# Patient Record
Sex: Female | Born: 1945 | Race: White | Hispanic: No | Smoking: Former smoker
Health system: Southern US, Community
[De-identification: ages and names within clinical notes are randomized; demographics above are authoritative.]

## PROBLEM LIST (undated history)

## (undated) DIAGNOSIS — E039 Hypothyroidism, unspecified: Secondary | ICD-10-CM

## (undated) DIAGNOSIS — F5231 Female orgasmic disorder: Secondary | ICD-10-CM

## (undated) DIAGNOSIS — F988 Other specified behavioral and emotional disorders with onset usually occurring in childhood and adolescence: Secondary | ICD-10-CM

## (undated) DIAGNOSIS — R6882 Decreased libido: Secondary | ICD-10-CM

## (undated) DIAGNOSIS — I351 Nonrheumatic aortic (valve) insufficiency: Secondary | ICD-10-CM

## (undated) DIAGNOSIS — N951 Menopausal and female climacteric states: Secondary | ICD-10-CM

## (undated) DIAGNOSIS — I639 Cerebral infarction, unspecified: Secondary | ICD-10-CM

## (undated) DIAGNOSIS — R112 Nausea with vomiting, unspecified: Secondary | ICD-10-CM

## (undated) DIAGNOSIS — B009 Herpesviral infection, unspecified: Secondary | ICD-10-CM

## (undated) DIAGNOSIS — N8189 Other female genital prolapse: Secondary | ICD-10-CM

## (undated) DIAGNOSIS — N83209 Unspecified ovarian cyst, unspecified side: Secondary | ICD-10-CM

## (undated) DIAGNOSIS — R87619 Unspecified abnormal cytological findings in specimens from cervix uteri: Secondary | ICD-10-CM

## (undated) DIAGNOSIS — Z9889 Other specified postprocedural states: Secondary | ICD-10-CM

## (undated) DIAGNOSIS — J329 Chronic sinusitis, unspecified: Secondary | ICD-10-CM

## (undated) DIAGNOSIS — D069 Carcinoma in situ of cervix, unspecified: Secondary | ICD-10-CM

## (undated) DIAGNOSIS — Z809 Family history of malignant neoplasm, unspecified: Secondary | ICD-10-CM

## (undated) HISTORY — PX: ROTATOR CUFF REPAIR: SHX139

## (undated) HISTORY — DX: Nonrheumatic aortic (valve) insufficiency: I35.1

## (undated) HISTORY — DX: Family history of malignant neoplasm, unspecified: Z80.9

## (undated) HISTORY — DX: Unspecified abnormal cytological findings in specimens from cervix uteri: R87.619

## (undated) HISTORY — DX: Chronic sinusitis, unspecified: J32.9

## (undated) HISTORY — PX: OTHER SURGICAL HISTORY: SHX169

## (undated) HISTORY — DX: Other specified behavioral and emotional disorders with onset usually occurring in childhood and adolescence: F98.8

## (undated) HISTORY — DX: Carcinoma in situ of cervix, unspecified: D06.9

## (undated) HISTORY — DX: Herpesviral infection, unspecified: B00.9

## (undated) HISTORY — PX: CARPAL TUNNEL RELEASE: SHX101

## (undated) HISTORY — DX: Unspecified ovarian cyst, unspecified side: N83.209

## (undated) HISTORY — PX: THUMB ARTHROSCOPY: SHX2509

## (undated) HISTORY — DX: Menopausal and female climacteric states: N95.1

## (undated) HISTORY — PX: KNEE ARTHROSCOPY: SUR90

## (undated) HISTORY — DX: Female orgasmic disorder: F52.31

## (undated) HISTORY — DX: Cerebral infarction, unspecified: I63.9

## (undated) HISTORY — DX: Other female genital prolapse: N81.89

## (undated) HISTORY — DX: Decreased libido: R68.82

---

## 1995-04-03 DIAGNOSIS — B009 Herpesviral infection, unspecified: Secondary | ICD-10-CM

## 1995-04-03 HISTORY — DX: Herpesviral infection, unspecified: B00.9

## 1997-04-02 DIAGNOSIS — R87619 Unspecified abnormal cytological findings in specimens from cervix uteri: Secondary | ICD-10-CM

## 1997-04-02 HISTORY — DX: Unspecified abnormal cytological findings in specimens from cervix uteri: R87.619

## 1997-11-29 ENCOUNTER — Other Ambulatory Visit: Admission: RE | Admit: 1997-11-29 | Discharge: 1997-11-29 | Payer: Self-pay | Admitting: Obstetrics and Gynecology

## 1998-02-17 ENCOUNTER — Other Ambulatory Visit: Admission: RE | Admit: 1998-02-17 | Discharge: 1998-02-17 | Payer: Self-pay | Admitting: Obstetrics and Gynecology

## 1998-06-07 ENCOUNTER — Other Ambulatory Visit: Admission: RE | Admit: 1998-06-07 | Discharge: 1998-06-07 | Payer: Self-pay | Admitting: Obstetrics and Gynecology

## 1998-10-25 ENCOUNTER — Other Ambulatory Visit: Admission: RE | Admit: 1998-10-25 | Discharge: 1998-10-25 | Payer: Self-pay | Admitting: Obstetrics and Gynecology

## 1999-02-13 ENCOUNTER — Other Ambulatory Visit: Admission: RE | Admit: 1999-02-13 | Discharge: 1999-02-13 | Payer: Self-pay | Admitting: Obstetrics and Gynecology

## 1999-04-03 DIAGNOSIS — D069 Carcinoma in situ of cervix, unspecified: Secondary | ICD-10-CM

## 1999-04-03 HISTORY — DX: Carcinoma in situ of cervix, unspecified: D06.9

## 1999-05-30 ENCOUNTER — Other Ambulatory Visit: Admission: RE | Admit: 1999-05-30 | Discharge: 1999-05-30 | Payer: Self-pay | Admitting: Obstetrics and Gynecology

## 1999-06-14 ENCOUNTER — Other Ambulatory Visit: Admission: RE | Admit: 1999-06-14 | Discharge: 1999-06-14 | Payer: Self-pay | Admitting: Obstetrics and Gynecology

## 1999-09-25 ENCOUNTER — Other Ambulatory Visit: Admission: RE | Admit: 1999-09-25 | Discharge: 1999-09-25 | Payer: Self-pay | Admitting: Obstetrics and Gynecology

## 1999-12-12 ENCOUNTER — Other Ambulatory Visit: Admission: RE | Admit: 1999-12-12 | Discharge: 1999-12-12 | Payer: Self-pay | Admitting: Obstetrics and Gynecology

## 2000-06-12 ENCOUNTER — Other Ambulatory Visit: Admission: RE | Admit: 2000-06-12 | Discharge: 2000-06-12 | Payer: Self-pay | Admitting: Obstetrics and Gynecology

## 2000-12-01 DIAGNOSIS — N8189 Other female genital prolapse: Secondary | ICD-10-CM

## 2000-12-01 HISTORY — DX: Other female genital prolapse: N81.89

## 2000-12-09 ENCOUNTER — Other Ambulatory Visit: Admission: RE | Admit: 2000-12-09 | Discharge: 2000-12-09 | Payer: Self-pay | Admitting: Obstetrics and Gynecology

## 2001-06-02 ENCOUNTER — Other Ambulatory Visit: Admission: RE | Admit: 2001-06-02 | Discharge: 2001-06-02 | Payer: Self-pay | Admitting: Obstetrics and Gynecology

## 2002-01-12 ENCOUNTER — Other Ambulatory Visit: Admission: RE | Admit: 2002-01-12 | Discharge: 2002-01-12 | Payer: Self-pay | Admitting: Obstetrics and Gynecology

## 2002-06-29 ENCOUNTER — Other Ambulatory Visit: Admission: RE | Admit: 2002-06-29 | Discharge: 2002-06-29 | Payer: Self-pay | Admitting: Obstetrics and Gynecology

## 2002-11-28 ENCOUNTER — Emergency Department (HOSPITAL_COMMUNITY): Admission: EM | Admit: 2002-11-28 | Discharge: 2002-11-28 | Payer: Self-pay | Admitting: Emergency Medicine

## 2002-12-22 ENCOUNTER — Encounter: Payer: Self-pay | Admitting: Emergency Medicine

## 2002-12-22 ENCOUNTER — Emergency Department (HOSPITAL_COMMUNITY): Admission: EM | Admit: 2002-12-22 | Discharge: 2002-12-23 | Payer: Self-pay | Admitting: Emergency Medicine

## 2002-12-23 ENCOUNTER — Ambulatory Visit (HOSPITAL_COMMUNITY): Admission: RE | Admit: 2002-12-23 | Discharge: 2002-12-23 | Payer: Self-pay | Admitting: Gastroenterology

## 2002-12-25 ENCOUNTER — Encounter: Payer: Self-pay | Admitting: Surgery

## 2002-12-25 ENCOUNTER — Encounter: Payer: Self-pay | Admitting: Gastroenterology

## 2002-12-25 ENCOUNTER — Ambulatory Visit (HOSPITAL_COMMUNITY): Admission: RE | Admit: 2002-12-25 | Discharge: 2002-12-25 | Payer: Self-pay | Admitting: Gastroenterology

## 2002-12-25 ENCOUNTER — Inpatient Hospital Stay (HOSPITAL_COMMUNITY): Admission: EM | Admit: 2002-12-25 | Discharge: 2002-12-28 | Payer: Self-pay | Admitting: Emergency Medicine

## 2002-12-26 ENCOUNTER — Encounter: Payer: Self-pay | Admitting: Surgery

## 2002-12-26 ENCOUNTER — Encounter (INDEPENDENT_AMBULATORY_CARE_PROVIDER_SITE_OTHER): Payer: Self-pay | Admitting: Specialist

## 2004-04-02 DIAGNOSIS — IMO0002 Reserved for concepts with insufficient information to code with codable children: Secondary | ICD-10-CM

## 2004-04-02 DIAGNOSIS — F5231 Female orgasmic disorder: Secondary | ICD-10-CM

## 2004-04-02 HISTORY — DX: Reserved for concepts with insufficient information to code with codable children: IMO0002

## 2004-04-02 HISTORY — DX: Female orgasmic disorder: F52.31

## 2004-05-23 ENCOUNTER — Other Ambulatory Visit: Admission: RE | Admit: 2004-05-23 | Discharge: 2004-05-23 | Payer: Self-pay | Admitting: Obstetrics and Gynecology

## 2005-05-24 ENCOUNTER — Other Ambulatory Visit: Admission: RE | Admit: 2005-05-24 | Discharge: 2005-05-24 | Payer: Self-pay | Admitting: Obstetrics and Gynecology

## 2007-04-29 ENCOUNTER — Ambulatory Visit (HOSPITAL_BASED_OUTPATIENT_CLINIC_OR_DEPARTMENT_OTHER): Admission: RE | Admit: 2007-04-29 | Discharge: 2007-04-29 | Payer: Self-pay | Admitting: Specialist

## 2008-04-02 DIAGNOSIS — N951 Menopausal and female climacteric states: Secondary | ICD-10-CM

## 2008-04-02 HISTORY — DX: Menopausal and female climacteric states: N95.1

## 2008-04-16 ENCOUNTER — Encounter: Admission: RE | Admit: 2008-04-16 | Discharge: 2008-04-16 | Payer: Self-pay | Admitting: Occupational Medicine

## 2009-04-02 DIAGNOSIS — R6882 Decreased libido: Secondary | ICD-10-CM

## 2009-04-02 HISTORY — DX: Decreased libido: R68.82

## 2010-05-11 ENCOUNTER — Other Ambulatory Visit: Payer: Self-pay | Admitting: Dermatology

## 2010-07-29 ENCOUNTER — Emergency Department (HOSPITAL_COMMUNITY): Payer: BC Managed Care – PPO

## 2010-07-29 ENCOUNTER — Observation Stay (HOSPITAL_COMMUNITY)
Admission: EM | Admit: 2010-07-29 | Discharge: 2010-07-31 | Disposition: A | Discharge status: Home / Self Care | Payer: BC Managed Care – PPO | Attending: Cardiology | Admitting: Cardiology

## 2010-07-29 DIAGNOSIS — R0789 Other chest pain: Secondary | ICD-10-CM

## 2010-07-29 DIAGNOSIS — R079 Chest pain, unspecified: Principal | ICD-10-CM | POA: Insufficient documentation

## 2010-07-29 DIAGNOSIS — R059 Cough, unspecified: Secondary | ICD-10-CM | POA: Insufficient documentation

## 2010-07-29 DIAGNOSIS — R9431 Abnormal electrocardiogram [ECG] [EKG]: Secondary | ICD-10-CM

## 2010-07-29 DIAGNOSIS — R05 Cough: Secondary | ICD-10-CM | POA: Insufficient documentation

## 2010-07-29 DIAGNOSIS — E039 Hypothyroidism, unspecified: Secondary | ICD-10-CM | POA: Insufficient documentation

## 2010-07-29 DIAGNOSIS — E7800 Pure hypercholesterolemia, unspecified: Secondary | ICD-10-CM | POA: Insufficient documentation

## 2010-07-29 LAB — CBC
HCT: 40.6 % (ref 36.0–46.0)
Hemoglobin: 14 g/dL (ref 12.0–15.0)
MCH: 31.3 pg (ref 26.0–34.0)
MCHC: 34.5 g/dL (ref 30.0–36.0)
MCV: 90.8 fL (ref 78.0–100.0)
Platelets: 225 10*3/uL (ref 150–400)
RBC: 4.47 MIL/uL (ref 3.87–5.11)
RDW: 13.5 % (ref 11.5–15.5)
WBC: 4.9 10*3/uL (ref 4.0–10.5)

## 2010-07-29 LAB — POCT CARDIAC MARKERS
CKMB, poc: 1.2 ng/mL (ref 1.0–8.0)
CKMB, poc: 1.2 ng/mL (ref 1.0–8.0)
Myoglobin, poc: 66.8 ng/mL (ref 12–200)
Myoglobin, poc: 61.4 ng/mL (ref 12–200)
Troponin i, poc: <0.05 ng/mL (ref 0.00–0.09)
Troponin i, poc: <0.05 ng/mL (ref 0.00–0.09)

## 2010-07-29 LAB — DIFFERENTIAL
Basophils Absolute: 0 10*3/uL (ref 0.0–0.1)
Basophils Relative: 0 % (ref 0–1)
Eosinophils Absolute: 0.1 10*3/uL (ref 0.0–0.7)
Eosinophils Relative: 2 % (ref 0–5)
Lymphocytes Relative: 25 % (ref 12–46)
Lymphs Abs: 1.2 10*3/uL (ref 0.7–4.0)
Monocytes Absolute: 0.6 10*3/uL (ref 0.1–1.0)
Monocytes Relative: 12 % (ref 3–12)
Neutro Abs: 3 10*3/uL (ref 1.7–7.7)
Neutrophils Relative %: 61 % (ref 43–77)

## 2010-07-29 LAB — TSH: TSH: 2.562 u[IU]/mL (ref 0.350–4.500)

## 2010-07-29 LAB — TROPONIN I: Troponin I: 0.01 ng/mL (ref 0.00–0.06)

## 2010-07-29 LAB — COMPREHENSIVE METABOLIC PANEL
ALT: 67 U/L — ABNORMAL HIGH (ref 0–35)
AST: 51 U/L — ABNORMAL HIGH (ref 0–37)
Albumin: 3.9 g/dL (ref 3.5–5.2)
Alkaline Phosphatase: 48 U/L (ref 39–117)
BUN: 15 mg/dL (ref 6–23)
CO2: 27 mEq/L (ref 19–32)
Calcium: 9 mg/dL (ref 8.4–10.5)
Chloride: 107 mEq/L (ref 96–112)
Creatinine, Ser: 0.69 mg/dL (ref 0.4–1.2)
GFR calc Af Amer: >60 mL/min (ref >60)
GFR calc non Af Amer: >60 mL/min (ref >60)
Glucose, Bld: 93 mg/dL (ref 70–99)
Potassium: 3.9 mEq/L (ref 3.5–5.1)
Sodium: 139 mEq/L (ref 135–145)
Total Bilirubin: 1.1 mg/dL (ref 0.3–1.2)
Total Protein: 6.8 g/dL (ref 6.0–8.3)

## 2010-07-29 LAB — HEPARIN LEVEL (UNFRACTIONATED): Heparin Unfractionated: 0.3 IU/mL (ref 0.30–0.70)

## 2010-07-29 LAB — CK TOTAL AND CKMB (NOT AT ARMC)
CK, MB: 3.7 ng/mL (ref 0.3–4.0)
Relative Index: INVALID (ref 0.0–2.5)
Total CK: 89 U/L (ref 7–177)

## 2010-07-29 LAB — CARDIAC PANEL(CRET KIN+CKTOT+MB+TROPI)
CK, MB: 2.6 ng/mL (ref 0.3–4.0)
Relative Index: INVALID (ref 0.0–2.5)
Total CK: 78 U/L (ref 7–177)
Troponin I: 0.02 ng/mL (ref 0.00–0.06)

## 2010-07-30 LAB — CBC
HCT: 37.4 % (ref 36.0–46.0)
Hemoglobin: 12.5 g/dL (ref 12.0–15.0)
MCH: 30.3 pg (ref 26.0–34.0)
MCHC: 33.4 g/dL (ref 30.0–36.0)
MCV: 90.6 fL (ref 78.0–100.0)
Platelets: 209 10*3/uL (ref 150–400)
RBC: 4.13 MIL/uL (ref 3.87–5.11)
RDW: 13.8 % (ref 11.5–15.5)
WBC: 4.8 10*3/uL (ref 4.0–10.5)

## 2010-07-30 LAB — TSH
TSH: 14.241 u[IU]/mL — ABNORMAL HIGH (ref 0.350–4.500)
TSH: 10.345 u[IU]/mL — ABNORMAL HIGH (ref 0.350–4.500)

## 2010-07-30 LAB — T4, FREE: Free T4: 1.07 ng/dL (ref 0.80–1.80)

## 2010-07-30 LAB — LIPID PANEL
Cholesterol: 216 mg/dL — ABNORMAL HIGH (ref 0–200)
HDL: 68 mg/dL (ref >39)
LDL Cholesterol: 136 mg/dL — ABNORMAL HIGH (ref 0–99)
Total CHOL/HDL Ratio: 3.2 RATIO
Triglycerides: 62 mg/dL (ref <150)
VLDL: 12 mg/dL (ref 0–40)

## 2010-07-30 LAB — HEPARIN LEVEL (UNFRACTIONATED): Heparin Unfractionated: 0.42 IU/mL (ref 0.30–0.70)

## 2010-07-30 LAB — CARDIAC PANEL(CRET KIN+CKTOT+MB+TROPI)
CK, MB: 2.4 ng/mL (ref 0.3–4.0)
Relative Index: INVALID (ref 0.0–2.5)
Total CK: 63 U/L (ref 7–177)
Troponin I: 0.01 ng/mL (ref 0.00–0.06)

## 2010-07-31 ENCOUNTER — Inpatient Hospital Stay (HOSPITAL_COMMUNITY): Payer: BC Managed Care – PPO

## 2010-07-31 DIAGNOSIS — R079 Chest pain, unspecified: Secondary | ICD-10-CM

## 2010-07-31 MED ORDER — IOHEXOL 350 MG/ML SOLN
80.0000 mL | Freq: Once | INTRAVENOUS | Status: AC | PRN
  Administered 2010-07-31: 80 mL via INTRAVENOUS

## 2010-08-01 DIAGNOSIS — F988 Other specified behavioral and emotional disorders with onset usually occurring in childhood and adolescence: Secondary | ICD-10-CM

## 2010-08-01 HISTORY — DX: Other specified behavioral and emotional disorders with onset usually occurring in childhood and adolescence: F98.8

## 2010-08-14 NOTE — Discharge Summary (Signed)
  NAME:  Erica Chase, Erica Chase NO.:  [id number]  MEDICAL RECORD NO.:  [id number]           PATIENT TYPE:  I  LOCATION:  3729                         FACILITY:  [facility]  PHYSICIAN:  Cassell Clement, M.D. DATE OF BIRTH:  [birth year]  DATE OF ADMISSION:  07/29/2010 DATE OF DISCHARGE:  07/31/2010                              DISCHARGE SUMMARY   PROCEDURES: 1. Cardiac CT. 2. Two-view chest x-ray.  PRIMARY FINAL DISCHARGE DIAGNOSIS:  Chest pain, low calcium score and no soft tissue abnormalities noted.  SECONDARY DIAGNOSES: 1. Status post resection of ovarian and ganglion cyst, right thumb     surgery, laparoscopic cholecystectomy and left knee arthroscopy. 2. Allergy or intolerance to BIAXIN and PENICILLIN. 3. Hypothyroidism, new diagnosis with a TSH of 14.241 and a free T4 of     1.07 this admission. 4. Hyperlipidemia, new diagnosis with a total cholesterol 216,     triglycerides 62, HDL 68 and LDL 136 this admission.  TIME AT DISCHARGE:  Thirty three minutes.  HOSPITAL COURSE:  Ms. Adolf is a 66 year old female with no previous cardiac history.  She has a nonproductive cough after a trip and was also having chest pain.  She was admitted for further evaluation.  Her cardiac enzymes were negative for MI.  A lipid profile as described above.  She had no other significant laboratory abnormalities.  Her symptoms improved.  A CT angiogram of the chest was performed on July 31, 2010, and it showed left main, LAD, two diagonals, circumflex, OM-1 and 2, and RCA all normal.  There was one prominent RV branch.  The test was reviewed and it was felt that no further inpatient cardiac workup was indicated.  Dr. Patty Sermons evaluated Ms. Waide and reviewed all the data.  He recommended low-dose Synthroid and follow up with primary care.  She will be encouraged to stick to a heart-healthy diet and is to get a recheck of her cholesterol tests once her thyroid function  is adequately treated.  Ms. Principato was ambulating without chest pain or shortness of breath and is considered stable for discharge on July 31, 2010.  DISCHARGE INSTRUCTIONS: 1. Her activity level is to be increased gradually. 2. She is encouraged to stick to a low-sodium heart-healthy diet. 3. She is to follow up with Dr. Antoine Poche as needed and she is to     follow up with Dr. Cleta Alberts in 6 weeks. 4. She is to get a recheck of her thyroid and cholesterol with Dr.     Cleta Alberts.  DISCHARGE MEDICATIONS: 1. Synthroid 25 mcg daily. 2. Robitussin DM 15 mL q.4 h. p.r.n. for cough. 3. Multivitamins daily. 4. Zyrtec 10 mg daily p.r.n. 5. Ventolin inhaler two puffs q.4 h. p.r.n.     Theodore Demark, PA-C   ______________________________ Cassell Clement, M.D.    RB/MEDQ  D:  07/31/2010  T:  08/01/2010  Job:  [id number]  cc:   Brett Canales A. Cleta Alberts, M.D.  Electronically Signed by Theodore Demark PA-C on 08/09/2010 05:25:12 PM Electronically Signed by Cassell Clement M.D. on 08/14/2010 04:43:16 AM

## 2010-08-15 ENCOUNTER — Other Ambulatory Visit: Payer: Self-pay | Admitting: Otolaryngology

## 2010-08-15 DIAGNOSIS — R05 Cough: Secondary | ICD-10-CM

## 2010-08-15 DIAGNOSIS — R059 Cough, unspecified: Secondary | ICD-10-CM

## 2010-08-15 DIAGNOSIS — R490 Dysphonia: Secondary | ICD-10-CM

## 2010-08-15 NOTE — Op Note (Signed)
NAME:  Erica Chase, Erica Chase NO.:  [id number]   MEDICAL RECORD NO.:  [id number]          PATIENT TYPE:  AMB   LOCATION:  NESC                         FACILITY:  [facility]   PHYSICIAN:  Jene Every, M.D.    DATE OF BIRTH:  [birth year]   DATE OF PROCEDURE:  04/29/2007  DATE OF DISCHARGE:                               OPERATIVE REPORT   PREOPERATIVE DIAGNOSES:  1. Lateral meniscus tear.  2. Chondromalacia of the left knee.   POSTOPERATIVE DIAGNOSES:  1. Lateral meniscus tear.  2. Chondromalacia of the left knee, grade 3 chondromalacia of the      medial femoral condyle.  3. Mild fraying of the lateral meniscus.   PROCEDURES PERFORMED:  1. Left knee arthroscopy.  2. Chondroplasty, medial femoral condyle.  3. Shaving of lateral meniscus.   BRIEF HISTORY AND INDICATION:  A 65 year old, knee injury at work,  indicating a possible lateral medial meniscus tear.  She had some medial  joint line tenderness as well.  Refractory to conservative treatment.  Was indicated for a diagnostic arthroscopy and treatment.  Risks and  benefits discussed including bleeding, infection, no change in her  symptoms, worsening symptoms, the need for repeat debridement in the  future, DVT, PE, anesthetic complications, etc.   TECHNIQUE:  Patient in supine position.  After induction of adequate  general anesthesia, 1 g Kefzol, the left lower extremity was prepped and  draped in the usual sterile fashion.  A lateral parapatellar portal and  a superomedial parapatellar portal were fashioned with a #11 blade,  ingress cannula atraumatically placed, and irrigant was utilized to  insufflate the joint.  Using direct visualization, a medial parapatellar  portal was fashioned with a #11 blade after localization with an 18-  gauge needle, sparing the medial meniscus.  Noted were some grade 3  changes in the medial femoral condyle.  The medial meniscus was stable  to probe palpation without evidence of  tear.  The chondral area of the  femoral condyle was probed.  It was approximately 2 mm thick.  I  introduced the shaver and utilized it to perform a brief chondroplasty  of the femoral condyle.  The remainder of the condyle was unremarkable,  as was the tibial plateau.  ACL and PCL were unremarkable.  Lateral  meniscus had some degenerative fraying of the anterior horn.  This was  shaved as well.  The remainder was stable to probe palpation without  evidence of significant tearing and it was stable.  Normal femoral  condyle and tibial plateau were appreciated and palpated.  In the  suprapatellar pouch, there was normal patellofemoral tracking.  No  evidence of significant chondromalacia.  The gutters were unremarkable.  There was some plica that did not appear to be pathologic as there was  flexion/extension without impingement upon the plica.   I reexamined the medial compartment.  No residual or pathology amenable  to arthroscopic intervention.  All instrumentation was removed.  The  portals were closed with 4-0 nylon simple sutures, 0.25% Marcaine with  epinephrine was infiltrated in the joint.  The wound was dressed  sterilely.  Awoken without difficulty and transported to the recovery  room in satisfactory condition.   The patient tolerated the procedure well with no complications.      Jene Every, M.D.  Electronically Signed     JB/MEDQ  D:  04/29/2007  T:  04/30/2007  Job:  [id number]

## 2010-08-16 ENCOUNTER — Ambulatory Visit
Admission: RE | Admit: 2010-08-16 | Discharge: 2010-08-16 | Disposition: A | Discharge status: Home / Self Care | Payer: BC Managed Care – PPO | Source: Ambulatory Visit | Attending: Otolaryngology | Admitting: Otolaryngology

## 2010-08-16 DIAGNOSIS — R05 Cough: Secondary | ICD-10-CM

## 2010-08-16 DIAGNOSIS — R490 Dysphonia: Secondary | ICD-10-CM

## 2010-08-16 DIAGNOSIS — R059 Cough, unspecified: Secondary | ICD-10-CM

## 2010-08-18 NOTE — Op Note (Signed)
   NAME:  Erica Chase, Erica Chase NO.:  [id number]   MEDICAL RECORD NO.:  [id number]                   PATIENT TYPE:  AMB   LOCATION:  ENDO                                 FACILITY:  [facility]   PHYSICIAN:  Anselmo Rod, M.D.               DATE OF BIRTH:  01/15/1946   DATE OF PROCEDURE:  12/23/2002  DATE OF DISCHARGE:                                 OPERATIVE REPORT   PROCEDURE PERFORMED:  Esophagogastroduodenoscopy.   ENDOSCOPIST:  Charna Elizabeth, M.D.   INSTRUMENT USED:  Olympus video panendoscope.   INDICATIONS FOR PROCEDURE:  Severe epigastric pain with a normal abdominal  ultrasound in a 65 year old white female.  Rule out peptic ulcer disease,  esophagitis, gastritis, etc.   PREPROCEDURE PREPARATION:  Informed consent was procured from the patient.  The patient was fasted for eight hours prior to the procedure.   PREPROCEDURE PHYSICAL:  The patient had stable vital signs.  Neck supple,  chest clear to auscultation.  S1, S2 regular.  Abdomen soft with normal  bowel sounds.   DESCRIPTION OF PROCEDURE:  The patient was placed in the left lateral  decubitus position and sedated with 50 mg of Demerol and 5 mg of Versed  intravenously.  Once the patient was adequately sedated and maintained on  low-flow oxygen and continuous cardiac monitoring, the Olympus video  panendoscope was advanced through the mouth piece over the tongue into the  esophagus under direct vision.  The entire esophagus appeared normal with no  evidence of ring, stricture, masses, esophagitis or Barrett's mucosa.  The  scope was then advanced to the stomach.  The entire gastric mucosa and  proximal small bowel appeared normal as well as retroflexion in the high  cardia revealed no abnormalities.   IMPRESSION:  Normal esophagogastroduodenoscopy.  No ulcers, erosions, masses  or polyps seen.   RECOMMENDATIONS:  1. Plan HIDA scan with CCK injections in the near future.  2. Continue  proton pump inhibitor.  3.     Avoid nonsteroidals.  4. Follow up in the office after the HIDA scan has been done..  5. Low fat diet for now.                                                Anselmo Rod, M.D.    JNM/MEDQ  D:  12/23/2002  T:  12/24/2002  Job:  [id number]   cc:   Janine Limbo, M.D.  [address], [address]  [place]  [place] [address]  Fax: [phone]

## 2010-08-18 NOTE — Discharge Summary (Signed)
   NAME:  OBIE, SILOS NO.:  [id number]   MEDICAL RECORD NO.:  [id number]                   PATIENT TYPE:  INP   LOCATION:  5705                                 FACILITY:  [facility]   PHYSICIAN:  Ollen Gross. Vernell Morgans, M.D.              DATE OF BIRTH:  12-Jan-1946   DATE OF ADMISSION:  12/25/2002  DATE OF DISCHARGE:  12/28/2002                                 DISCHARGE SUMMARY   Ms. Primiano is a 65 year old white female who was admitted with possible  biliary dyskinesia.  She was taken to the operating room on December 26, 2002, where she underwent a laparoscopic cholecystectomy with IOC which was  normal.  She tolerated the procedure well and on postoperative day #1 was  ready for discharge home.   FINAL DIAGNOSIS:  Biliary dyskinesia.   CONDITION ON DISCHARGE:  Stable.   DIET:  As tolerated.   ACTIVITY:  No heavy lifting.   DISCHARGE MEDICATIONS:  She is to resume her home medications.  She was  given a prescription for Vicodin for pain.   FOLLOWUP:  Follow up will be with Dr. Carolynne Edouard in two weeks.   DISPOSITION:  She was discharged home.                                                Ollen Gross. Vernell Morgans, M.D.    PST/MEDQ  D:  02/09/2003  T:  02/09/2003  Job:  [id number]

## 2010-08-18 NOTE — H&P (Signed)
NAME:  Erica Chase, Erica Chase NO.:  [id number]   MEDICAL RECORD NO.:  [id number]                   PATIENT TYPE:  INP   LOCATION:  5705                                 FACILITY:  [facility]   PHYSICIAN:  Abigail Miyamoto, M.D.              DATE OF BIRTH:  Aug 24, 1945   DATE OF ADMISSION:  12/25/2002  DATE OF DISCHARGE:                                HISTORY & PHYSICAL   DATE OF PROCEDURE:  December 25, 2002.   CHIEF COMPLAINT:  Abdominal pain.   HISTORY:  This is a 64 year old female, patient of Dr. Anselmo Rod, who  has had now for approximately two months epigastric abdominal pain and  nausea which has been getting worse.  She reports that she has had several  attacks of pain which now appears to be precipitated by anything she eats.  She is a vegetarian.  She denies any jaundice with this and has had no  emesis.  She is also moving her bowels well.  She has had no fever, chills,  chest pain, or shortness of breath.  The pains are described as sharp and  epigastric.  She has had no dysuria.   PAST MEDICAL HISTORY:  Negative.   PAST SURGICAL HISTORY:  Surgery for ovarian cyst and a ganglion cyst removal  on the left wrist.   MEDICATIONS:  None.   ALLERGIES:  No known drug allergies.   SOCIAL HISTORY:  She does not smoke.  She does not drink alcohol.   PHYSICAL EXAMINATION:  GENERAL: Thin, well developed, well nourished female  in no acute distress.  VITAL SIGNS:  She is afebrile.  Vital signs are stable.  HEENT:  Eyes: She is anicteric.  Pupils are reactive bilaterally.  Ears,  nose, mouth, and throat: External ears and nose are normal. Oropharynx is  clear.  NECK:  Supple.  There is no cervical adenopathy.  There is no thyromegaly.  LUNGS:  Respiratory effort is normal.  Lungs clear to auscultation  bilaterally.  CARDIOVASCULAR:  Regular rate and rhythm with no murmurs.  There is no  peripheral edema.  ABDOMEN:  Soft and flat.  There is mild  to moderate tenderness in the  epigastrium with minimal guarding.  There are no masses.  There is no  organomegaly.  There are no hernias.  EXTREMITIES:  Warm and well perfused.   LABORATORY DATA INCLUDING X-RAY DATA:  The patient has an ultrasound showing  her to have normal-appearing gallbladder.  There is a question of some fluid  around the gallbladder fossa.  There is normal common bile duct.  There are  no stones.   HIDA scan performed shows a 3% ejection fraction.   The patient has laboratory data showing white blood count of 10 with 87%  neutrophils.  Hemoglobin normal.  Liver function tests normal except for a  bilirubin of 2.0.   IMPRESSION AND PLAN:  This is  a patient with biliary dyskinesia and possible  chronic cholecystitis.  At this point, the plan will be to admit her to the  hospital for IV rehydration and then proceed with cholecystectomy and  cholangiogram.  I have discussed the laparoscopic approach with her.  We  have discussed the risks of surgery including bleeding, infection, common  bile duct injury, need for open surgery, bile leak, and the chance this may  not solve all her discomfort.  At this point, she wishes to proceed.  Surgery will thus be scheduled.                                                Abigail Miyamoto, M.D.    DB/MEDQ  D:  12/25/2002  T:  12/26/2002  Job:  [id number]   cc:   Anselmo Rod, M.D.  [address]  [address]  [place]  [place] [address]  Fax: [phone]

## 2010-08-18 NOTE — Op Note (Signed)
NAME:  Erica Chase, Erica Chase NO.:  [id number]   MEDICAL RECORD NO.:  [id number]                   PATIENT TYPE:  INP   LOCATION:  5705                                 FACILITY:  [facility]   PHYSICIAN:  Ollen Gross. Vernell Morgans, M.D.              DATE OF BIRTH:  1945-09-11   DATE OF PROCEDURE:  12/26/2002  DATE OF DISCHARGE:  12/28/2002                                 OPERATIVE REPORT   PREOPERATIVE DIAGNOSES:  Biliary dyskinesia.   POSTOPERATIVE DIAGNOSES:  Biliary dyskinesia.   OPERATION PERFORMED:  Laparoscopic cholecystectomy with intraoperative  cholangiogram.   SURGEON:  Ollen Gross. Carolynne Edouard, M.D.   ASSISTANT:  Currie Paris, M.D.   ANESTHESIA:  General endotracheal.   DESCRIPTION OF PROCEDURE:  After informed consent was obtained, the patient  was brought to the operating room and placed in supine position on the  operating table.  After adequate induction of general anesthesia, the  patient's abdomen was prepped with Betadine and draped in the usual sterile  manner.  The area below the umbilicus was infiltrated with 25% Marcaine. A  small incision was made with a 15 blade knife.  This incision was carried  down through the subcutaneous tissue bluntly with a Kelly clamp and army-  navy retractors until the linea alba was identified.  The linea alba was  incised with a 15 blade knife.  Each side was addressed with Kocher clamps  and elevated anteriorly.  The preperitoneal space was then probed bluntly  with a hemostat until the peritoneum was opened and access was gained to the  abdominal cavity.  A 0 Vicryl pursestring stitch was placed in the fascia  surrounding the opening.  Hasson cannula was placed through the opening and  anchored in place with a previously placed Vicryl pursestring stitch.  The  abdomen was then insufflated with carbon without difficulty.  The patient  was then placed in the head up position.  The laparoscope was placed through  the  Hasson cannula and the right upper quadrant was inspected.  The liver  and dome of the gallbladder were readily identified. Next, the epigastric  region was infiltrated with 0.25% Marcaine and a small incision was made  with a 15 blade knife and a 10 mm port was placed bluntly through this  incision into the abdominal cavity under direct vision.  Sites were then  chosen laterally on the right side of the abdomen for placement of 5 mm  ports.  Each of these areas was infiltrated with 0.25% Marcaine.  Small stab  incisions were made with a 15 blade knife.  5 mm ports were then placed  bluntly through these incisions into the abdominal cavity under direct  vision.  A blunt grasper was placed through the lateral most 5 mm port and  used to grasp the dome of the gallbladder and elevate it anteriorly and  superiorly.  Another blunt grasper  was placed through the other 5 mm port  and used to retract on the body and neck of the gallbladder.  A dissector  was placed through the upper midline port and using the combination of blunt  dissection and electrocautery, some adhesions to the body of the gallbladder  were taken down.  Once this was accomplished, the gallbladder was very  mobile and was able to be elevated.  The peritoneal reflection of the  gallbladder neck was then opened using electrocautery and blunt dissection  was then carried out in this area until the gallbladder neck, cystic duct  junction was readily identified and a good window was created.  A single  clip was placed on the gallbladder neck.  A small ductotomy was made.  A 14  gauge Angiocath was placed percutaneously through the anterior abdominal  wall under direct vision.  A Reddick cholangiogram catheter was then placed  through the Angiocath and flushed.  The Reddick catheter was placed within  the cystic duct anchored in placed within the cystic duct and anchored in  place with the clip.  The cholangiogram was obtained.  This  showed good  length of the cystic duct, no filling defects and good emptying into the  duodenum.  The anchoring clip and catheter was then removed from the  patient.  Three clips were placed proximally on the cystic duct and the duct  was divided between the two sets of clips.  Posterior to this, the cystic  artery was identified and again dissected in a circumferential manner  bluntly until a good window was created.  Two clips were placed proximally  and one distally on the artery and the artery was divided between the two.  Next, a laparoscopic cautery device was used to separate the gallbladder  from the liver bed.  Prior to completely detaching the gallbladder from the  liver bed, the liver bed was inspected and several small bleeding points  were coagulated with the electrocautery.  The gallbladder was then detached  the rest of the way from the liver bed without difficulty.  The laparoscope  was then moved to the upper midline port and the gallbladder grasper was  placed through the Hasson cannula.  The gallbladder neck was then grasped  with a gallbladder grasper and removed with the Hasson cannula through the  infraumbilical port without difficulty.  The fascial defect was closed with  the previously placed Vicryl pursestring stitch.  The abdomen was then  irrigated with copious amounts of saline until the effluent was clear.  The  liver bed was inspected again and found to be hemostatic.  At this point the  ports were removed under direct vision and all were found to be hemostatic.  The gas was allowed to escape.  The skin incisions were all closed with  interrupted 4-0 Monocryl subcuticular stitches.  Benzoin and Steri-Strips  were applied.  The patient tolerated the procedure well.  At the end of the  case all sponge, needle and instrument counts were correct.  The patient was  awakened and taken to the recovery room in stable condition.                                                   Ollen Gross. Vernell Morgans, M.D.    PST/MEDQ  D:  12/26/2002  T:  12/28/2002  Job:  [id number]

## 2010-09-20 NOTE — H&P (Signed)
NAME:  NABA, SNEED NO.:  [id number]  MEDICAL RECORD NO.:  [id number]           PATIENT TYPE:  I  LOCATION:  3729                         FACILITY:  [facility]  PHYSICIAN:  Rollene Rotunda, MD, FACCDATE OF BIRTH:  01/09/1946  DATE OF ADMISSION:  07/29/2010 DATE OF DISCHARGE:                             HISTORY & PHYSICAL   PRIMARY REFERRING:  Brett Canales A. Daub, MD  REASON FOR PRESENTATION:  Evaluate the patient with an abnormal EKG and chest discomfort.  HISTORY OF PRESENT ILLNESS:  The patient is a pleasant 65 year old white female without prior cardiac history.  She has actually been quite well and practices aggressive preventive care.  She has had a cough.  She had this few weeks ago, and felt it might be allergies.  It actually improved after she traveled to the [place].  However, she has returned here, and has had persistent cough, nonproductive.  She was treated with some over-the-counter medications including Zyrtec without improvement. She does not had fevers or chills.  She presented to Urgent Care today to discuss this cough.  There, she did describe 4-5 days with her chest discomfort.  This has been left upper chest.  It comes on sporadically. Last for several minutes at a time.  It is perhaps 3/10 in intensity. There is no radiation to the jaw or to the arms.  She cannot reproduce this with activity, and she is physically active, exercising routinely. She does not get associated nausea, vomiting, or diaphoresis.  She does not have any palpitations, presyncope, or syncope.  Today in Urgent Care, she did have some discomfort under her left breast, which was not like previous.  She cannot quantify or qualify this.  However, she was found to have T-wave inversions in the inferior leads and also in lead V3 and V4.  There was some slight evidence of this and previous EKGs though this was more pronounced.  There was no ST-segment elevation. She does have  early transition in lead V2.  Point-of-care markers x1 have been negative.  PAST MEDICAL HISTORY:  None.  PAST SURGICAL HISTORY:  Ovarian cyst resected, ganglion cyst resected, right thumb surgery, laparoscopic cholecystectomy, left knee arthroscopy.  ALLERGIES:  BIAXIN and PENICILLIN.  MEDICATIONS:  None.  SOCIAL HISTORY:  The patient quit smoking in 1970.  She is a Runner, broadcasting/film/video with special needs.  She exercises routinely.  She believes in nutraceuticals.  FAMILY HISTORY:  Noncontributory for early coronary artery disease.  REVIEW OF SYSTEMS:  As stated in the HPI, negative for all other systems.  PHYSICAL EXAMINATION:  GENERAL:  The patient is pleasant and in no distress. VITAL SIGNS:  Blood pressure 123/57, heart rate 61 and regular, afebrile. HEENT:  Eyes are unremarkable.  Pupils equal, round, and reactive to light, fundi not visualized, oral mucosa normal. NECK:  No jugular venous distention at 45 degrees.  Carotid upstroke brisk and symmetrical.  No bruits.  No thyromegaly. LYMPHATICS:  No cervical, axillary, or inguinal adenopathy. LUNGS:  Clear to auscultation bilaterally. BACK:  No costovertebral angle tenderness. CHEST:  Unremarkable. HEART:  PMI not displaced or sustained.  S1 and S2 within  normal limits. No S3, no S4, no clicks, rubs, no murmurs. ABDOMEN:  Flat, positive bowel sounds, normal in frequency and pitch, no bruits, no rebound, no guarding, no midline pulsatile mass, no hepatomegaly, no splenomegaly. SKIN:  No rashes, no nodules. EXTREMITIES:  A 2+ pulses throughout, no edema, no cyanosis, no clubbing. NEURO:  Oriented to person, place, and time, cranial nerves II through XII grossly intact, motor grossly intact throughout.  EKG, sinus rhythm, rate 59, axis leftward, intervals within normal limits, inferior T-wave inversions II, III, aVF, anterolateral T-wave inversion in V2-V6.  LABORATORY DATA:  Sodium 139, potassium 3.9, BUN 15, creatinine  0.69. WBC 4.9, hemoglobin 14.0, platelets 225.  POC negative x1.  Chest x-ray, no disease.  ASSESSMENT AND PLAN: 1. Abnormal EKG.  The patient does have an abnormal EKG with some     chest discomfort.  However, the chest discomfort is somewhat     atypical.  I think that pretest probability of obstructive coronary     disease is moderate.  I thinks cardiac CT angiography would be an     excellent modality to rule out obstructive coronary disease, and I     will order this.  We will manage her with low-dose aspirin and     heparin, and I will cycle enzymes.  I will also check risk factors     to include a fasting lipid profile. 2. Cough.  I will treat this with guaifenesin and symptomatically as     there does not appear to be any abnormality on chest x-ray.     Rollene Rotunda, MD, [facility]     JH/MEDQ  D:  07/29/2010  T:  07/29/2010  Job:  [id number]  cc:   Brett Canales A. Cleta Alberts, M.D.  Electronically Signed by Rollene Rotunda MD [facility] on 09/20/2010 11:27:27 AM

## 2010-11-06 ENCOUNTER — Ambulatory Visit (HOSPITAL_COMMUNITY)
Admission: RE | Admit: 2010-11-06 | Discharge: 2010-11-06 | Disposition: A | Discharge status: Home / Self Care | Payer: BC Managed Care – PPO | Source: Ambulatory Visit | Attending: Otolaryngology | Admitting: Otolaryngology

## 2010-11-06 DIAGNOSIS — K219 Gastro-esophageal reflux disease without esophagitis: Secondary | ICD-10-CM | POA: Insufficient documentation

## 2010-11-06 DIAGNOSIS — R490 Dysphonia: Secondary | ICD-10-CM | POA: Insufficient documentation

## 2011-01-01 DIAGNOSIS — J329 Chronic sinusitis, unspecified: Secondary | ICD-10-CM

## 2011-01-01 HISTORY — PX: CHOLECYSTECTOMY: SHX55

## 2011-01-01 HISTORY — DX: Chronic sinusitis, unspecified: J32.9

## 2011-02-26 ENCOUNTER — Other Ambulatory Visit: Payer: Self-pay | Admitting: Otolaryngology

## 2011-03-01 ENCOUNTER — Encounter (HOSPITAL_BASED_OUTPATIENT_CLINIC_OR_DEPARTMENT_OTHER): Payer: Self-pay | Admitting: *Deleted

## 2011-03-01 NOTE — Progress Notes (Signed)
To [facility] at 1245. Hg and EKG on arrival.NPO  after mn.may have clear liquids only until 0800,to use inhaler that am and take synthroid.

## 2011-03-05 ENCOUNTER — Ambulatory Visit (HOSPITAL_BASED_OUTPATIENT_CLINIC_OR_DEPARTMENT_OTHER): Payer: BC Managed Care – PPO | Admitting: Anesthesiology

## 2011-03-05 ENCOUNTER — Encounter (HOSPITAL_BASED_OUTPATIENT_CLINIC_OR_DEPARTMENT_OTHER): Payer: Self-pay | Admitting: *Deleted

## 2011-03-05 ENCOUNTER — Encounter (HOSPITAL_BASED_OUTPATIENT_CLINIC_OR_DEPARTMENT_OTHER)
Admission: RE | Disposition: A | Discharge status: Home / Self Care | Payer: Self-pay | Source: Ambulatory Visit | Attending: Specialist

## 2011-03-05 ENCOUNTER — Encounter (HOSPITAL_BASED_OUTPATIENT_CLINIC_OR_DEPARTMENT_OTHER): Payer: Self-pay | Admitting: Anesthesiology

## 2011-03-05 ENCOUNTER — Ambulatory Visit (HOSPITAL_BASED_OUTPATIENT_CLINIC_OR_DEPARTMENT_OTHER)
Admission: RE | Admit: 2011-03-05 | Discharge: 2011-03-05 | Disposition: A | Discharge status: Home / Self Care | Payer: BC Managed Care – PPO | Source: Ambulatory Visit | Attending: Specialist | Admitting: Specialist

## 2011-03-05 DIAGNOSIS — X58XXXA Exposure to other specified factors, initial encounter: Secondary | ICD-10-CM | POA: Insufficient documentation

## 2011-03-05 DIAGNOSIS — Z79899 Other long term (current) drug therapy: Secondary | ICD-10-CM | POA: Insufficient documentation

## 2011-03-05 DIAGNOSIS — M2240 Chondromalacia patellae, unspecified knee: Secondary | ICD-10-CM | POA: Insufficient documentation

## 2011-03-05 DIAGNOSIS — J45909 Unspecified asthma, uncomplicated: Secondary | ICD-10-CM | POA: Insufficient documentation

## 2011-03-05 DIAGNOSIS — IMO0002 Reserved for concepts with insufficient information to code with codable children: Secondary | ICD-10-CM | POA: Insufficient documentation

## 2011-03-05 DIAGNOSIS — S83289A Other tear of lateral meniscus, current injury, unspecified knee, initial encounter: Secondary | ICD-10-CM

## 2011-03-05 HISTORY — PX: KNEE ARTHROSCOPY: SHX127

## 2011-03-05 HISTORY — DX: Other specified postprocedural states: R11.2

## 2011-03-05 HISTORY — DX: Hypothyroidism, unspecified: E03.9

## 2011-03-05 HISTORY — DX: Other specified postprocedural states: Z98.890

## 2011-03-05 LAB — POCT HEMOGLOBIN-HEMACUE: Hemoglobin: 14.1 g/dL (ref 12.0–15.0)

## 2011-03-05 SURGERY — ARTHROSCOPY, KNEE
Anesthesia: General | Site: Knee | Laterality: Left

## 2011-03-05 MED ORDER — PROMETHAZINE HCL 25 MG/ML IJ SOLN: 6.2500 mg | INTRAMUSCULAR | Status: DC | PRN

## 2011-03-05 MED ORDER — PROPOFOL 10 MG/ML IV EMUL
INTRAVENOUS | Status: DC | PRN
  Administered 2011-03-05: 200 mg via INTRAVENOUS

## 2011-03-05 MED ORDER — FENTANYL CITRATE 0.05 MG/ML IJ SOLN: 25.0000 ug | INTRAMUSCULAR | Status: DC | PRN

## 2011-03-05 MED ORDER — SODIUM CHLORIDE 0.9 % IR SOLN
Status: DC | PRN
  Administered 2011-03-05: 15:00:00

## 2011-03-05 MED ORDER — LACTATED RINGERS IV SOLN: INTRAVENOUS | Status: DC

## 2011-03-05 MED ORDER — HYDROCODONE-ACETAMINOPHEN 5-325 MG PO TABS
1.0000 | ORAL_TABLET | Freq: Four times a day (QID) | ORAL | Status: AC | PRN
  Administered 2011-03-05: 1 via ORAL

## 2011-03-05 MED ORDER — MIDAZOLAM HCL 5 MG/5ML IJ SOLN
INTRAMUSCULAR | Status: DC | PRN
  Administered 2011-03-05 (×2): 1 mg via INTRAVENOUS

## 2011-03-05 MED ORDER — ONDANSETRON HCL 4 MG/2ML IJ SOLN
INTRAMUSCULAR | Status: DC | PRN
  Administered 2011-03-05: 4 mg via INTRAVENOUS

## 2011-03-05 MED ORDER — DEXAMETHASONE SODIUM PHOSPHATE 4 MG/ML IJ SOLN
INTRAMUSCULAR | Status: DC | PRN
  Administered 2011-03-05: 8 mg via INTRAVENOUS

## 2011-03-05 MED ORDER — CHLORHEXIDINE GLUCONATE 4 % EX LIQD: 60.0000 mL | Freq: Once | CUTANEOUS | Status: DC

## 2011-03-05 MED ORDER — EPHEDRINE SULFATE 50 MG/ML IJ SOLN
INTRAMUSCULAR | Status: DC | PRN
  Administered 2011-03-05: 10 mg via INTRAVENOUS

## 2011-03-05 MED ORDER — LACTATED RINGERS IV SOLN
INTRAVENOUS | Status: DC
  Administered 2011-03-05: 13:00:00 via INTRAVENOUS
  Administered 2011-03-05: 100 mL/h via INTRAVENOUS
  Administered 2011-03-05: 16:00:00 via INTRAVENOUS

## 2011-03-05 MED ORDER — CEFAZOLIN SODIUM 1-5 GM-% IV SOLN
1.0000 g | INTRAVENOUS | Status: AC
  Administered 2011-03-05: 1 g via INTRAVENOUS

## 2011-03-05 MED ORDER — LIDOCAINE HCL (CARDIAC) 20 MG/ML IV SOLN
INTRAVENOUS | Status: DC | PRN
  Administered 2011-03-05: 60 mg via INTRAVENOUS

## 2011-03-05 MED ORDER — SCOPOLAMINE 1 MG/3DAYS TD PT72
1.0000 | MEDICATED_PATCH | TRANSDERMAL | Status: DC
  Administered 2011-03-05: 1.5 mg via TRANSDERMAL

## 2011-03-05 MED ORDER — ASPIRIN BUFFERED 325 MG PO TABS: 325.0000 mg | ORAL_TABLET | Freq: Every day | ORAL | Status: DC

## 2011-03-05 MED ORDER — FENTANYL CITRATE 0.05 MG/ML IJ SOLN
INTRAMUSCULAR | Status: DC | PRN
  Administered 2011-03-05: 50 ug via INTRAVENOUS
  Administered 2011-03-05: 25 ug via INTRAVENOUS
  Administered 2011-03-05: 50 ug via INTRAVENOUS

## 2011-03-05 MED ORDER — BUPIVACAINE-EPINEPHRINE 0.5% -1:200000 IJ SOLN
INTRAMUSCULAR | Status: DC | PRN
  Administered 2011-03-05: 25 mL

## 2011-03-05 SURGICAL SUPPLY — 47 items
BANDAGE ELASTIC 6 VELCRO ST LF (GAUZE/BANDAGES/DRESSINGS) ×2 IMPLANT
BLADE 4.2CUDA (BLADE) IMPLANT
BLADE CUDA SHAVER 3.5 (BLADE) ×2 IMPLANT
BLADE GREAT WHITE 4.2 (BLADE) IMPLANT
BOOTIES KNEE HIGH SLOAN (MISCELLANEOUS) ×2 IMPLANT
CANISTER SUCT LVC 12 LTR MEDI- (MISCELLANEOUS) ×2 IMPLANT
CANISTER SUCTION 1200CC (MISCELLANEOUS) IMPLANT
CANISTER SUCTION 2500CC (MISCELLANEOUS) IMPLANT
CANNULA ACUFLEX KIT 5X76 (CANNULA) IMPLANT
CAST PADDING STERILE 6X4 (CAST SUPPLIES) ×1 IMPLANT
CLOTH BEACON ORANGE TIMEOUT ST (SAFETY) ×2 IMPLANT
CUTTER MENISCUS  4.2MM (BLADE)
CUTTER MENISCUS 4.2MM (BLADE) IMPLANT
DRAPE ARTHROSCOPY W/POUCH 114 (DRAPES) ×2 IMPLANT
DRSG PAD ABDOMINAL 8X10 ST (GAUZE/BANDAGES/DRESSINGS) ×1 IMPLANT
DURAPREP 26ML APPLICATOR (WOUND CARE) ×2 IMPLANT
ELECT REM PT RETURN 9FT ADLT (ELECTROSURGICAL)
ELECTRODE REM PT RTRN 9FT ADLT (ELECTROSURGICAL) IMPLANT
GAUZE SPONGE 4X4 12PLY STRL LF (GAUZE/BANDAGES/DRESSINGS) ×1 IMPLANT
GLOVE BIO SURGEON STRL SZ 6.5 (GLOVE) ×1 IMPLANT
GLOVE BIOGEL PI IND STRL 6.5 (GLOVE) IMPLANT
GLOVE BIOGEL PI INDICATOR 6.5 (GLOVE) ×1
GLOVE ECLIPSE 6.0 STRL STRAW (GLOVE) ×2 IMPLANT
GLOVE SURG SS PI 8.0 STRL IVOR (GLOVE) ×2 IMPLANT
GOWN BRE IMP SLV AUR LG STRL (GOWN DISPOSABLE) ×1 IMPLANT
GOWN PREVENTION PLUS LG XLONG (DISPOSABLE) ×3 IMPLANT
GOWN STRL REIN XL XLG (GOWN DISPOSABLE) ×2 IMPLANT
IV NS IRRIG 3000ML ARTHROMATIC (IV SOLUTION) ×4 IMPLANT
KNEE WRAP E Z 3 GEL PACK (MISCELLANEOUS) ×2 IMPLANT
MINI VAC (SURGICAL WAND) IMPLANT
NDL FILTER BLUNT 18X1 1/2 (NEEDLE) ×1 IMPLANT
NDL SAFETY ECLIPSE 18X1.5 (NEEDLE) ×2 IMPLANT
NEEDLE FILTER BLUNT 18X 1/2SAF (NEEDLE) ×1
NEEDLE FILTER BLUNT 18X1 1/2 (NEEDLE) ×1 IMPLANT
NEEDLE HYPO 18GX1.5 SHARP (NEEDLE) ×4
PACK ARTHROSCOPY DSU (CUSTOM PROCEDURE TRAY) ×2 IMPLANT
PACK BASIN DAY SURGERY FS (CUSTOM PROCEDURE TRAY) ×2 IMPLANT
PADDING CAST COTTON 6X4 STRL (CAST SUPPLIES) ×2 IMPLANT
SET ARTHROSCOPY TUBING (MISCELLANEOUS) ×2
SET ARTHROSCOPY TUBING LN (MISCELLANEOUS) ×1 IMPLANT
SPONGE GAUZE 4X4 12PLY (GAUZE/BANDAGES/DRESSINGS) ×2 IMPLANT
SUT ETHILON 4 0 PS 2 18 (SUTURE) ×2 IMPLANT
SYR 30ML LL (SYRINGE) ×2 IMPLANT
SYRINGE 10CC LL (SYRINGE) ×2 IMPLANT
TOWEL OR 17X24 6PK STRL BLUE (TOWEL DISPOSABLE) ×2 IMPLANT
WAND 90 DEG TURBOVAC W/CORD (SURGICAL WAND) IMPLANT
WATER STERILE IRR 500ML POUR (IV SOLUTION) ×2 IMPLANT

## 2011-03-05 NOTE — Interval H&P Note (Signed)
History and Physical Interval Note:  03/05/2011 2:39 PM  Erica Chase  has presented today for surgery, with the diagnosis of MENISCAL TEAR OF LEFT KNEE  The various methods of treatment have been discussed with the patient and family. After consideration of risks, benefits and other options for treatment, the patient has consented to  Procedure(s): ARTHROSCOPY KNEE as a surgical intervention .  The patients' history has been reviewed, patient examined, no change in status, stable for surgery.  I have reviewed the patients' chart and labs.  Questions were answered to the patient's satisfaction.     [name]

## 2011-03-05 NOTE — Transfer of Care (Signed)
Immediate Anesthesia Transfer of Care Note  Patient: Erica Chase  Procedure(s) Performed:  ARTHROSCOPY KNEE - left knee scope with debridement , Partial lateral meniscectomy  Patient Location: Patient transported to PACU with oxygen via face mask at 6 Liters / Min  Anesthesia Type: General  Level of Consciousness: awake and alert   Airway & Oxygen Therapy: Patient Spontanous Breathing and Patient connected to face mask oxygen Post-op Assessment: Report given to PACU RN and Post -op Vital signs reviewed and stable  Post vital signs: Reviewed and stable  Complications: No apparent anesthesia complications

## 2011-03-05 NOTE — Anesthesia Procedure Notes (Signed)
Procedure Name: LMA Insertion Date/Time: 03/05/2011 3:29 PM Performed by: Lorrin Jackson Pre-anesthesia Checklist: Patient identified, Emergency Drugs available, Suction available and Patient being monitored Patient Re-evaluated:Patient Re-evaluated prior to inductionOxygen Delivery Method: Circle System Utilized Preoxygenation: Pre-oxygenation with 100% oxygen Intubation Type: IV induction Ventilation: Mask ventilation without difficulty LMA: LMA with gastric port inserted LMA Size: 4.0 Number of attempts: 1 Placement Confirmation: positive ETCO2 Tube secured with: Tape Dental Injury: Teeth and Oropharynx as per pre-operative assessment

## 2011-03-05 NOTE — H&P (Signed)
  AOI KOUNS is an 65 y.o. female.   Chief Complaint: left knee pain HPI:  Patient complains of left knee pain with out relief from conservative treatment.  They note continued mechanical knee symptoms.  It is felt that the patient would benefit from knee arthroscopy at this time. Risks and benefits of surgery discussed with patient and they wish to proceed. Past Medical History  Diagnosis Date  . Asthma     unsure-,reflux ruled out using nasonex and pulmocort  . PONV (postoperative nausea and vomiting)     scop patch has worked well in past    Past Surgical History  Procedure Date  . Knee arthroscopy   . Thumb arthroscopy     No family history on file. Social History:  reports that she quit smoking about 42 years ago. She does not have any smokeless tobacco history on file. She reports that she drinks alcohol. Her drug history not on file.  Allergies:  Allergies  Allergen Reactions  . Biaxin     rash    No current facility-administered medications on file as of .   Medications Prior to Admission  Medication Sig Dispense Refill  . budesonide (PULMICORT) 0.25 MG/2ML nebulizer solution Take 0.25 mg by nebulization daily. Dosage unconfirmed       . levothyroxine (SYNTHROID, LEVOTHROID) 100 MCG tablet Take 100 mcg by mouth daily. Dosage unconfirmed       . mometasone (NASONEX) 50 MCG/ACT nasal spray Place 2 sprays into the nose daily.          No results found for this or any previous visit (from the past 48 hour(s)). No results found.  Review of Systems: The patient denies any fever, chills, night sweats, or bleeding tendencies. CNS: No blurred double vision, seizure, headache, paralysis. RESPIRATORY: No shortness of breath, productive cough, or hemoptysis. CARDIOVASCULAR: No chest pain, angina, or orthopnea, GU: No dysuria, hematuria, or discharge. MUSCULOSKELETAL: Pertinent per HPI.  Height 5\' 4"  (1.626 m), weight 58.06 kg (128 lb).  GENERAL: Patient is a 65 y.o.  female, well-nourished, well-developed, no acute distress. Alert, oriented, cooperative. Walks with antalgic gait HENT:  Normocephalic, atraumatic. Pupils round and reactive. EOMs intact. NECK:  Supple, no bruits. CHEST:  Clear to anterior and posterior chest walls. No rhonchi, rales, wheezes. HEART:  Regular, rate and rhythm.  No murmurs.  S1 and S2 noted. ABDOMEN:  Soft, nontender, bowel sounds present. RECTAL/BREAST/GENITALIA:  Not done, not pertinent to present illness. EXTREMITIES:  TTP medial joint line, + McMurray, mild crepitus , mild edema  Assessment/Plan Pt will undergo knee arthroscopy and debridement  STRADER,CHRISTINE R. 03/05/2011, 12:13 PM

## 2011-03-05 NOTE — Brief Op Note (Signed)
03/05/2011  4:03 PM  PATIENT:  Erica Chase  65 y.o. female  PRE-OPERATIVE DIAGNOSIS:  MENISCAL TEAR OF LEFT KNEE  POST-OPERATIVE DIAGNOSIS:  ENISCAL TEAR OF LEFT KNEE  PROCEDURE:  Procedure(s): ARTHROSCOPY KNEE  SURGEON:  Surgeon(s): [employer]  PHYSICIAN ASSISTANT:   ASSISTANTS: none   ANESTHESIA:   general  EBL:  Total I/O In: 300 [I.V.:300] Out: -   BLOOD ADMINISTERED:none  DRAINS: none   LOCAL MEDICATIONS USED:  MARCAINE 25CC  SPECIMEN:  No Specimen  DISPOSITION OF SPECIMEN:  N/A  COUNTS:  YES  TOURNIQUET:  * No tourniquets in log *  DICTATION: .Other Dictation: Dictation Number [id number]  PLAN OF CARE: Discharge to home after PACU  PATIENT DISPOSITION:  PACU - hemodynamically stable.   Delay start of Pharmacological VTE agent (>24hrs) due to surgical blood loss or risk of bleeding:  {YES/NO/NOT APPLICABLE:20182

## 2011-03-05 NOTE — Anesthesia Postprocedure Evaluation (Signed)
Anesthesia Post Note  Patient: Erica Chase  Procedure(s) Performed:  ARTHROSCOPY KNEE - left knee scope with debridement , Partial lateral meniscectomy  Anesthesia type: General  Patient location: PACU  Post pain: Pain level controlled  Post assessment: Post-op Vital signs reviewed  Last Vitals:  Filed Vitals:   03/05/11 1255  BP: 127/73  Pulse: 59  Temp: 36.3 C  Resp: 18    Post vital signs: Reviewed  Level of consciousness: sedated  Complications: No apparent anesthesia complications

## 2011-03-05 NOTE — Op Note (Signed)
NAME:  Erica Chase, Erica Chase NO.:  [id number]  MEDICAL RECORD NO.:  [id number]  LOCATION:  [facility]                         FACILITY:  [facility]  PHYSICIAN:  Jene Every, M.D.    DATE OF BIRTH:  1946-02-23  DATE OF PROCEDURE: DATE OF DISCHARGE:  11/06/2010                              OPERATIVE REPORT   PREOPERATIVE DIAGNOSIS:  Lateral meniscus tear, left knee.  POSTOPERATIVE DIAGNOSES:  Lateral meniscus tear, left knee, grade 3 chondromalacia, medial femoral condyle.  PROCEDURES PERFORMED: 1. Left knee arthroscopy. 2. Partial lateral meniscectomy. 3. Light chondroplasty, medial femoral condyle.  ANESTHESIA:  General.  ASSISTANT:  None.  BRIEF HISTORY:  This is a 65 year old female with knee pain, status post accident, persistent pain, MRI indicating anterior horn lateral meniscus tear, locking, popping, giving way mechanical symptoms, refractory of conservative treatment indicated for knee arthroscopy.  Risks and benefits discussed including bleeding, infection.  No change in symptoms, worsening symptoms, need for repeat debridement, DVT, PE, anesthetic complications, etc.  TECHNIQUE:  The patient in supine position.  After induction of adequate anesthesia, 1 g Kefzol, left lower extremity prepped and draped in usual sterile fashion.  A lateral parapatellar portal was fashioned with a #11 blade.  Ingress cannula atraumatically placed.  Irrigant was utilized to insufflate the joint.  Under direct visualization, medial parapatellar portal was fashioned with a #11 blade after localization with 18-gauge needle sparing the medial meniscus.  Noted was some grade 3 changes in the medial femoral condyle.  Medial meniscus was unremarkable as was the tibial plateau.  Light chondral flap tear was noted.  Light chondroplasty was performed here.  ACL demonstrates mild fraying.  Otherwise intact.  Lateral compartment reveals anterior and mid meniscus lateral meniscus tear,  radial.  There was 2 separate tears.  I introduced a basket and resected 10% of the anterior half of the meniscus.  The remnant was stable to probe palpation, further contoured with 3 to 5 Cuda shaver. Femoral condyle and tibial plateau showed some minor grade 2 changes.  Suprapatellar pouch revealed normal patellofemoral tracking.  Minimal grade 2 changes here.  The gutters were unremarkable.  Revisit all compartments, no further pathology amenable arthroscopic intervention. Therefore, removed all instrumentation.  Portals were closed with 4-0 nylon simple sutures, 0.25% copious Marcaine with epinephrine was infiltrated in the joint.  Wound was dressed sterilely. Awoken without difficulty and transported to recovery room in satisfactory condition. The patient tolerated the procedure well.  No complications.  No assistants.     Jene Every, M.D.     Cordelia Pen  D:  03/05/2011  T:  03/05/2011  Job:  [id number]

## 2011-03-05 NOTE — Anesthesia Preprocedure Evaluation (Signed)
Anesthesia Evaluation  Patient identified by MRN, date of birth, ID band Patient awake    Reviewed: Allergy & Precautions, H&P , NPO status , Patient's Chart, lab work & pertinent test results, reviewed documented beta blocker date and time   History of Anesthesia Complications (+) PONV  Airway Mallampati: II TM Distance: >3 FB Neck ROM: full    Dental No notable dental hx. (+) Upper Dentures and Partial Lower   Pulmonary neg pulmonary ROS, asthma ,  History of inflammation of vocal cords requiring oral steroid and antibiotic Rx beginning in March 2012. State symptoms are 98% improved. clear to auscultation  Pulmonary exam normal       Cardiovascular Exercise Tolerance: Good neg cardio ROS     Neuro/Psych Negative Neurological ROS  Negative Psych ROS   GI/Hepatic negative GI ROS, Neg liver ROS,   Endo/Other  Negative Endocrine ROS  Renal/GU negative Renal ROS  Genitourinary negative   Musculoskeletal   Abdominal Normal abdominal exam  (+)   Peds  Hematology negative hematology ROS (+)   Anesthesia Other Findings   Reproductive/Obstetrics negative OB ROS                           Anesthesia Physical Anesthesia Plan  ASA: II  Anesthesia Plan: General LMA   Post-op Pain Management:    Induction:   Airway Management Planned:   Additional Equipment:   Intra-op Plan:   Post-operative Plan:   Informed Consent: I have reviewed the patients History and Physical, chart, labs and discussed the procedure including the risks, benefits and alternatives for the proposed anesthesia with the patient or authorized representative who has indicated his/her understanding and acceptance.   Dental Advisory Given  Plan Discussed with: CRNA  Anesthesia Plan Comments:         Anesthesia Quick Evaluation

## 2011-03-09 ENCOUNTER — Encounter (HOSPITAL_BASED_OUTPATIENT_CLINIC_OR_DEPARTMENT_OTHER): Payer: Self-pay | Admitting: Specialist

## 2011-07-10 ENCOUNTER — Telehealth: Payer: Self-pay | Admitting: Obstetrics and Gynecology

## 2011-07-17 NOTE — Telephone Encounter (Signed)
See notes in incoming call section. jamie

## 2011-08-10 ENCOUNTER — Telehealth: Payer: Self-pay | Admitting: Obstetrics and Gynecology

## 2011-08-10 DIAGNOSIS — R5383 Other fatigue: Secondary | ICD-10-CM

## 2011-08-10 DIAGNOSIS — E039 Hypothyroidism, unspecified: Secondary | ICD-10-CM

## 2011-08-10 NOTE — Telephone Encounter (Signed)
Tc to pt per telephone call. Pt wants to know if any bloodwork is needed prior to aex sched 08-28-11 with avs due to pt being on Synthroid pres by avs. Pt also usually gets chol testing as well. Will consult with avs per recs and cb. Pt voices understanding.

## 2011-08-10 NOTE — Telephone Encounter (Signed)
triage

## 2011-08-13 ENCOUNTER — Other Ambulatory Visit: Payer: Self-pay

## 2011-08-13 DIAGNOSIS — E039 Hypothyroidism, unspecified: Secondary | ICD-10-CM

## 2011-08-13 DIAGNOSIS — R5383 Other fatigue: Secondary | ICD-10-CM

## 2011-08-13 NOTE — Telephone Encounter (Signed)
Tc to pt per avs recs. Pt to have lipid profile, CBC, CMP, Vit D, TSH, Free T4 prior to aex per pt's req. Will review results at aex on 08-28-11 with avs. Pt opts to have bloodwork done at [facility]). Order sent. Pt to be fasting. Pt agrees.

## 2011-08-23 ENCOUNTER — Other Ambulatory Visit: Payer: Self-pay | Admitting: Obstetrics and Gynecology

## 2011-08-23 LAB — COMPREHENSIVE METABOLIC PANEL
ALT: 27 U/L (ref 0–35)
AST: 29 U/L (ref 0–37)
Albumin: 4.4 g/dL (ref 3.5–5.2)
Alkaline Phosphatase: 46 U/L (ref 39–117)
BUN: 21 mg/dL (ref 6–23)
CO2: 27 mEq/L (ref 19–32)
Calcium: 9.3 mg/dL (ref 8.4–10.5)
Chloride: 107 mEq/L (ref 96–112)
Creat: 0.79 mg/dL (ref 0.50–1.10)
Glucose, Bld: 78 mg/dL (ref 70–99)
Potassium: 4.2 mEq/L (ref 3.5–5.3)
Sodium: 139 mEq/L (ref 135–145)
Total Bilirubin: 0.8 mg/dL (ref 0.3–1.2)
Total Protein: 6.5 g/dL (ref 6.0–8.3)

## 2011-08-23 LAB — CBC
HCT: 41.8 % (ref 36.0–46.0)
Hemoglobin: 14 g/dL (ref 12.0–15.0)
MCH: 29.7 pg (ref 26.0–34.0)
MCHC: 33.5 g/dL (ref 30.0–36.0)
MCV: 88.7 fL (ref 78.0–100.0)
Platelets: 281 10*3/uL (ref 150–400)
RBC: 4.71 MIL/uL (ref 3.87–5.11)
RDW: 13.6 % (ref 11.5–15.5)
WBC: 3.7 10*3/uL — ABNORMAL LOW (ref 4.0–10.5)

## 2011-08-23 LAB — TSH: TSH: 4.721 u[IU]/mL — ABNORMAL HIGH (ref 0.350–4.500)

## 2011-08-23 LAB — T4, FREE: Free T4: 0.98 ng/dL (ref 0.80–1.80)

## 2011-08-24 LAB — VITAMIN D 25 HYDROXY (VIT D DEFICIENCY, FRACTURES): Vit D, 25-Hydroxy: 53 ng/mL (ref 30–89)

## 2011-08-28 ENCOUNTER — Encounter: Payer: Self-pay | Admitting: Obstetrics and Gynecology

## 2011-08-28 ENCOUNTER — Ambulatory Visit (INDEPENDENT_AMBULATORY_CARE_PROVIDER_SITE_OTHER): Payer: BC Managed Care – PPO | Admitting: Obstetrics and Gynecology

## 2011-08-28 VITALS — BP 118/76 | Ht 63.5 in | Wt 131.0 lb

## 2011-08-28 DIAGNOSIS — E7800 Pure hypercholesterolemia, unspecified: Secondary | ICD-10-CM | POA: Insufficient documentation

## 2011-08-28 DIAGNOSIS — Z01419 Encounter for gynecological examination (general) (routine) without abnormal findings: Secondary | ICD-10-CM

## 2011-08-28 DIAGNOSIS — Z124 Encounter for screening for malignant neoplasm of cervix: Secondary | ICD-10-CM

## 2011-08-28 DIAGNOSIS — R6882 Decreased libido: Secondary | ICD-10-CM | POA: Insufficient documentation

## 2011-08-28 DIAGNOSIS — B009 Herpesviral infection, unspecified: Secondary | ICD-10-CM

## 2011-08-28 DIAGNOSIS — D069 Carcinoma in situ of cervix, unspecified: Secondary | ICD-10-CM

## 2011-08-28 DIAGNOSIS — E039 Hypothyroidism, unspecified: Secondary | ICD-10-CM

## 2011-08-28 DIAGNOSIS — N951 Menopausal and female climacteric states: Secondary | ICD-10-CM

## 2011-08-28 MED ORDER — VALACYCLOVIR HCL 500 MG PO TABS: 500.0000 mg | ORAL_TABLET | Freq: Every day | ORAL | Status: AC

## 2011-08-28 NOTE — Progress Notes (Signed)
Addended by: Janine Limbo on: 08/28/2011 10:18 PM   Modules accepted: Level of Service

## 2011-08-28 NOTE — Progress Notes (Signed)
Last  Pap:06/21/2009 WNL: Yes Regular Periods:no Contraception: None  Monthly Breast exam:yes Tetanus<59yrs:no Nl.Bladder Function:yes Daily BMs:yes Healthy Diet:yes Calcium:no Mammogram:yes2/24/2010 Normal  Exercise:yes Seatbelt: yes Abuse at home: no Stressful work:no Sigmoid-colonoscopy:02/09/2011 Small polyps Bone Density: Yes 05/26/2008 Normal  Subjective:    Erica Chase is a 66 y.o. female G3P0 who presents for annual exam. The patient complains of fatigue, decreased libido, and inability to concentrate.  She has a history of hypothyroidism.  She is currently on Synthroid but reports that she feels worse on Synthroid and she fell off of Synthroid.  She has a history of CIN 3.  She was treated with herbal medicines.  Her most recent Pap smears have been normal.  Her laboratory evaluation was within normal limits including a normal free T4.  She has a history of hypercholesterolemia.  She plans to retire from her job this year.  She will travel with her significant other.  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 She has a history of herpes virus and infrequent outbreaks.  The following portions of the patient's history were reviewed and updated as appropriate: allergies, current medications, past family history, past medical history, past social history, past surgical history and problem list. See above.  Review of Systems Pertinent items are noted in HPI. Gastrointestinal:No change in bowel habits, no abdominal pain, no rectal bleeding Genitourinary:negative for dysuria, frequency, hematuria, nocturia and urinary incontinence    Objective:     BP 118/76  Ht 5' 3.5" (1.613 m)  Wt 131 lb (59.421 kg)  BMI 22.84 kg/m2  Weight:  Wt Readings from Last 1 Encounters:  08/28/11 131 lb (59.421 kg)     BMI: Body mass index is 22.84 kg/(m^2). General Appearance: Alert, appropriate appearance for age. No acute distress HEENT: Grossly normal Neck / Thyroid: Supple, no masses,  nodes or enlargement Lungs: clear to auscultation bilaterally Back: No CVA tenderness Breast Exam: No masses or nodes.No dimpling, nipple retraction or discharge. Cardiovascular: Regular rate and rhythm. S1, S2, no murmur Gastrointestinal: Soft, non-tender, no masses or organomegaly  ++++++++++++++++++++++++++++++++++++++++++++++++++++++++  Pelvic Exam: External genitalia: normal general appearance Vaginal: atrophic mucosa Cervix: normal appearance Adnexa: normal bimanual exam Uterus: normal size shape and consistency Rectovaginal: normal rectal, no masses  ++++++++++++++++++++++++++++++++++++++++++++++++++++++++  Lymphatic Exam: Non-palpable nodes in neck, clavicular, axillary, or inguinal regions  Psychiatric: Alert and oriented, appropriate affect.    Urinalysis:Not done      Assessment:    Normal gyn exam   Herpes virus  History of CIN-3  Hypothyroidism  Fatigue  Hypercholesterolemia  Occasional insomnia  Decreased libido  Trouble concentrating  Overweight or obese: No  Pelvic relaxation: Yes  Menopausal symptoms: Yes. Severe: No.   Plan:    Mammogram. Pap smear.   Valtrex 500 mg daily Ambien 5-10 mg daily at bedtime.  Risk and benefits reviewed.  Sleep walking discussed.  The patient wants to stop her Synthroid but she we'll wait until after her trip to [place].  She will keep a law cup or symptoms on Synthroid and then off.  We will repeat her thyroid studies when she returns.  Decreased libido and trouble concentrating discussed.  The patient does not want hormone therapy.  Bone density scan  Follow-up:  in 4 month(s)  STD screen request: none,  RPR: No,  HBsAg: No.  Hepatitis C: No.  The updated Pap smear screening guidelines were discussed with the patient. The patient requested that I obtain a Pap smear: Yes.  Kegel exercises discussed: Yes.  Proper diet and regular exercise were reviewed.  Annual mammograms recommended starting  at age 64. Proper breast care was discussed.  Screening colonoscopy is recommended beginning at age 24.  Regular health maintenance was reviewed.  Sleep hygiene was discussed.  Adequate calcium and vitamin D intake was emphasized.  Mylinda Latina.D.

## 2011-08-30 ENCOUNTER — Telehealth: Payer: Self-pay

## 2011-08-30 NOTE — Telephone Encounter (Signed)
Per Dr.Stringer since Valtrex 500 mg Rx printed and he was not able to send Ambien 10mg  through to pt pharm electronically  Rx was called in to [facility] on [address] as Dr.stringer directed. [facility] CMA

## 2011-08-30 NOTE — Telephone Encounter (Signed)
Triage/epic 

## 2011-09-02 LAB — PAP IG W/ RFLX HPV ASCU

## 2011-10-01 ENCOUNTER — Telehealth: Payer: Self-pay | Admitting: Obstetrics and Gynecology

## 2011-10-01 MED ORDER — ACYCLOVIR 5 % EX OINT: TOPICAL_OINTMENT | CUTANEOUS | Status: DC

## 2011-10-01 NOTE — Telephone Encounter (Signed)
Spoke with pt rgd msg pt wants rx for zovirax cream called to pharm informed pt rx sent to pharm pt voice understanding per protocol

## 2011-10-01 NOTE — Telephone Encounter (Signed)
Triage/rx [facility]epic

## 2011-11-06 ENCOUNTER — Telehealth: Payer: Self-pay | Admitting: Obstetrics and Gynecology

## 2011-11-06 NOTE — Telephone Encounter (Signed)
[facility]IAGE/REQ.

## 2011-11-07 ENCOUNTER — Telehealth: Payer: Self-pay

## 2011-11-07 NOTE — Telephone Encounter (Signed)
Pt called and stated she would like to have blood work ordered Pt does not need labs done until Sept 2013.  Pt was called today at 11:20am to confirm what orders she wants [place] to order for blood work. Pt was not ava. Voicemail was left for pt to call the office back.   [facility] CMA

## 2011-11-14 ENCOUNTER — Telehealth: Payer: Self-pay

## 2011-11-14 NOTE — Telephone Encounter (Signed)
Pt was called again to confirm which labs she needed orders for. There was no ava. Voicemail.  [facility] CMA

## 2011-11-29 ENCOUNTER — Other Ambulatory Visit: Payer: Self-pay

## 2011-11-29 ENCOUNTER — Telehealth: Payer: Self-pay

## 2011-11-29 DIAGNOSIS — E039 Hypothyroidism, unspecified: Secondary | ICD-10-CM

## 2011-11-29 NOTE — Telephone Encounter (Signed)
Pt was called back regard appt w/AVS Orders were approved by AVS  For Free T4, and TSH Pt will go to [facility] on [address]. Orders were faxed w/ INS card pt will see AVS on 12-17-11 @ 1:30pm

## 2011-12-06 LAB — T4, FREE: Free T4: 0.93 ng/dL (ref 0.80–1.80)

## 2011-12-06 LAB — TSH: TSH: 6.516 u[IU]/mL — ABNORMAL HIGH (ref 0.350–4.500)

## 2011-12-17 ENCOUNTER — Encounter: Payer: Self-pay | Admitting: Obstetrics and Gynecology

## 2011-12-17 ENCOUNTER — Ambulatory Visit (INDEPENDENT_AMBULATORY_CARE_PROVIDER_SITE_OTHER): Payer: BC Managed Care – PPO | Admitting: Obstetrics and Gynecology

## 2011-12-17 VITALS — BP 118/68 | Ht 64.0 in | Wt 137.0 lb

## 2011-12-17 DIAGNOSIS — E039 Hypothyroidism, unspecified: Secondary | ICD-10-CM

## 2011-12-17 NOTE — Progress Notes (Signed)
HISTORY OF PRESENT ILLNESS  [name] is a 66 y.o. year old female,G3P0, who presents for a problem visit. The patient has a known history of hypothyroidism.  She is hesitant to take medications.  She discontinued her Synthroid earlier this year.  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  Subjective:  The patient reports that she does feel tired.  She has trouble sleeping.  She is finding it more difficult to control her weight. She continues to have menopausal symptoms.  Objective:  BP 118/68  Ht 5\' 4"  (1.626 m)  Wt 137 lb (62.143 kg)  BMI 23.52 kg/m2   General: no distress  Exam deferred.  CBC: Within normal limits except for white blood cell count of 3700. Comprehensive metabolic panel: Within normal limits.                                                08/2011                         12/2011  TSH                                         4.721                           6.516 Free T4                                    0.98                             0.93  Assessment:  Hypothyroidism. Menopausal symptoms.  Plan:  A 25 min. discussion was held with the patient and her significant other.  Greater than 60% of the visit was face-to-face.The above lab results were reviewed and the patient was given a copy.  The implications of the labs were discussed.  Treatment options were outlined.  Questions were answered.  I recommend that the patient start back on Synthroid 25 g daily.  Return to office in 3 month(s).   Leonard Schwartz M.D.  12/17/2011 6:43 PM

## 2011-12-19 ENCOUNTER — Other Ambulatory Visit: Payer: Self-pay

## 2011-12-19 ENCOUNTER — Telehealth: Payer: Self-pay

## 2011-12-19 NOTE — Telephone Encounter (Signed)
Per AVS call in Synthroid #90 , 2 RF 1 each day To [facility] three - month pharm. Rx was called in per [facility].

## 2012-03-10 ENCOUNTER — Telehealth: Payer: Self-pay | Admitting: Obstetrics and Gynecology

## 2012-03-10 ENCOUNTER — Other Ambulatory Visit: Payer: BC Managed Care – PPO

## 2012-03-10 DIAGNOSIS — E039 Hypothyroidism, unspecified: Secondary | ICD-10-CM

## 2012-03-10 NOTE — Telephone Encounter (Signed)
To be sched by [facility].

## 2012-03-11 LAB — TSH: TSH: 2.547 u[IU]/mL (ref 0.350–4.500)

## 2012-03-13 NOTE — Progress Notes (Signed)
Quick Note:  Send a letter that her labs are normal. Include a copy of the labs with the letter. ______

## 2012-03-17 ENCOUNTER — Other Ambulatory Visit: Payer: Medicare Other

## 2012-03-18 ENCOUNTER — Ambulatory Visit (INDEPENDENT_AMBULATORY_CARE_PROVIDER_SITE_OTHER): Payer: BC Managed Care – PPO | Admitting: Obstetrics and Gynecology

## 2012-03-18 ENCOUNTER — Encounter: Payer: Self-pay | Admitting: Obstetrics and Gynecology

## 2012-03-18 VITALS — BP 126/80 | Ht 64.0 in | Wt 136.0 lb

## 2012-03-18 DIAGNOSIS — R635 Abnormal weight gain: Secondary | ICD-10-CM

## 2012-03-18 DIAGNOSIS — L659 Nonscarring hair loss, unspecified: Secondary | ICD-10-CM

## 2012-03-18 DIAGNOSIS — E039 Hypothyroidism, unspecified: Secondary | ICD-10-CM

## 2012-03-18 NOTE — Progress Notes (Signed)
HISTORY OF PRESENT ILLNESS  [name] is a 66 y.o. year old female,G3P0, who presents for a problem visit. The patient complains of fatigue, and increased weight. She is known to be hypothyroid.  She discontinued her medication.  Her TSH was elevated. She now takes Synthroid 25 g daily. Her most recent TSH was 2.54.  Subjective:  The patient complains that her eyebrows are getting thinner in her eyelashes are getting thinner.  Objective:  BP 126/80  Ht 5\' 4"  (1.626 m)  Wt 136 lb (61.689 kg)  BMI 23.34 kg/m2   General: no distress  HEENT:  thin eyebrows  Exam deferred.  Assessment:  Hypothyroidism  Fatigue  Increased weight  Plan:  A 25 min. Visit (greater than 50% effaced with face) was held about hypothyroidism and menopausal changes.  The patient agrees to continue her Synthroid.  I recommended that the patient see a nutritionist and a Systems analyst.  She will return in May 2014 for an exam.  She was an ophthalmologist about possible medication to help her eyebrows and eyelashes.  Leonard Schwartz M.D.  03/18/2012 9:56 PM

## 2012-05-02 ENCOUNTER — Encounter: Payer: Self-pay | Admitting: Obstetrics and Gynecology

## 2012-05-02 NOTE — Progress Notes (Signed)
FAx to [facility] Rx. Per DR AVS Rx for levothyroxine 25 mcg 1 po q day #90 with 2 Rf.

## 2013-04-02 HISTORY — PX: CATARACT EXTRACTION: SUR2

## 2013-06-29 ENCOUNTER — Other Ambulatory Visit: Payer: Self-pay | Admitting: Internal Medicine

## 2013-06-29 DIAGNOSIS — R519 Headache, unspecified: Secondary | ICD-10-CM

## 2013-06-29 DIAGNOSIS — R51 Headache: Principal | ICD-10-CM

## 2013-07-03 ENCOUNTER — Ambulatory Visit
Admission: RE | Admit: 2013-07-03 | Discharge: 2013-07-03 | Disposition: A | Discharge status: Home / Self Care | Payer: 59 | Source: Ambulatory Visit | Attending: Internal Medicine | Admitting: Internal Medicine

## 2013-07-03 DIAGNOSIS — R51 Headache: Principal | ICD-10-CM

## 2013-07-03 DIAGNOSIS — R519 Headache, unspecified: Secondary | ICD-10-CM

## 2013-12-31 ENCOUNTER — Other Ambulatory Visit: Payer: Self-pay | Admitting: Dermatology

## 2014-02-01 ENCOUNTER — Encounter: Payer: Self-pay | Admitting: Obstetrics and Gynecology

## 2014-04-26 ENCOUNTER — Other Ambulatory Visit: Payer: Self-pay | Admitting: Dermatology

## 2015-03-16 ENCOUNTER — Other Ambulatory Visit: Payer: Self-pay | Admitting: Orthopedic Surgery

## 2015-04-20 ENCOUNTER — Ambulatory Visit: Payer: Self-pay | Admitting: Orthopedic Surgery

## 2015-04-22 ENCOUNTER — Ambulatory Visit: Payer: Self-pay | Admitting: Orthopedic Surgery

## 2015-04-22 NOTE — H&P (Signed)
Erica Chase is an 70 y.o. female.   Chief Complaint: R shoulder pain HPI: The patient is a 70 year old female who presents today for follow up of their shoulder. The patient is being followed for their right shoulder pain. They are 2 month(s) out from when symptoms began. Symptoms reported today include: pain. Current treatment includes: NSAIDs (Ibuprofen). The following medication has been used for pain control: antiinflammatory medication. The patient presents today following MRI.  Erica Chase follows up, bilateral shoulder pain. She is having her MRI on her left shoulder today, having pain on the right.   Past Medical History  Diagnosis Date  . Asthma     unsure-,reflux ruled out using nasonex and pulmocort  . PONV (postoperative nausea and vomiting)     scop patch has worked well in past  . Hypothyroidism   . HSV infection 1997  . Atypical endocervical cells on Pap smear 1999  . 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 Pelvic relaxation 12/2000  . Abdominal pain 2004  . Menopausal symptoms 2010  . Libido, decreased 2011  . Orgasmic dysfunction 2006  . ADD (attention deficit disorder) 08/2010  . Allergic rhinitis 01/2011  . Sinusitis 01/2011  . FHx: cancer     Past Surgical History  Procedure Laterality Date  . Knee arthroscopy    . Thumb arthroscopy    . Knee arthroscopy  03/05/2011    Procedure: ARTHROSCOPY KNEE;  Surgeon: Javier Docker;  Location: [facility];  Service: Orthopedics;  Laterality: Left;  left knee scope with debridement , Partial lateral meniscectomy  . Cholecystectomy  01/2011    Family History  Problem Relation Age of Onset  . Hearing loss Brother    Social History:  reports that she quit smoking about 46 years ago. She does not have any smokeless tobacco history on file. She reports that she drinks alcohol. She reports that she does not use illicit drugs.  Allergies:  Allergies   Allergen Reactions  . Clarithromycin     rash  . Penicillins      (Not in a hospital admission)  No results found for this or any previous visit (from the past 48 hour(s)). No results found.  Review of Systems  Constitutional: Negative.   HENT: Negative.   Eyes: Negative.   Respiratory: Negative.   Cardiovascular: Negative.   Gastrointestinal: Negative.   Genitourinary: Negative.   Musculoskeletal: Positive for joint pain.  Skin: Negative.   Psychiatric/Behavioral: Negative.     There were no vitals taken for this visit. Physical Exam  Constitutional: She is oriented to person, place, and time. She appears well-developed.  HENT:  Head: Normocephalic.  Eyes: Pupils are equal, round, and reactive to light.  Neck: Normal range of motion.  Cardiovascular: Normal rate.   Respiratory: Effort normal.  GI: Soft.  Musculoskeletal:  On exam, tender in the anterior subacromial region. Decreased internal rotation and forward flexion. Nontender over the [facility].  Inspection of the shoulder revealed no ecchymosis, soft tissue swelling or deformity. On range of motion the patient had full range of motion. Provocative signs indicated no impingement sign, no sulcus sign, and negative speed's test. Negative lift off. Sensory exam was intact and motor function was normal in the deltoid and the rotator cuff.  Positive impingement sign on the left shoulder.  Neurological: She is alert and oriented to person, place, and time.  Skin: Skin is warm.    MRI demonstrates  a small near full-thickness tear of the supraspinatus 80%, mild adhesive capsulitis, and AC arthrosis.  Assessment/Plan 1. Symptomatic rotator cuff tear, right shoulder with moderate adhesive capsulitis, rotator cuff arthropathy. 2. Impingement syndrome, rotator cuff arthropathy, left shoulder.  We discussed options. She is going to proceed with MRI of her left shoulder. She came back after the MRI of her left shoulder to  proceed with a corticosteroid injection in the left shoulder. After discussing risks and benefits with the patient, we proceeded with a shoulder injection. I sterilely prepped the posterior subacromial region with Betadine and alcohol and injected the patient with 1 mL of Aristospan and 8 mL of 1% Lidocaine solution with reduction of pain. Post-injection care was discussed with the patient.  We discussed rotator cuff repair on the right. I had a long discussion with the patient concerning the risks and benefits of a right rotator cuff repair, including bleeding, infection, prolonged postoperative recovery, which may require 3 to 5 months until maximum medical improvement. Overnight procedure with initiation of early passive range of motion within physical therapy. Avoid any active motion for the first six weeks. This is all in an effort to avoid recurrent tear of the rotator cuff and adhesive capsulitis. Return to work without use of the arm can be obtained following two weeks. However, driving will be a challenge. We also discussed the possibility of requiring implants including bone anchors, as well as an Allograft patch graft if a massive rotator cuff tear is encountered. Removal of any bones for spurs as well as bursitis will be performed during the procedure and also any associated anesthetic complications as well.  She is going to make an appointment on Friday and discuss her options, living with her current condition versus rotator cuff repair.  Plan right shoulder MUA/EUA, arthroscopy, mini-open RCR   Erica Chase M. PA-C for Dr. Shelle Iron 04/22/2015, 11:19 AM

## 2015-05-19 ENCOUNTER — Ambulatory Visit: Admit: 2015-05-19 | Payer: Self-pay | Admitting: Specialist

## 2015-05-19 SURGERY — ARTHROSCOPY, SHOULDER WITH REPAIR, ROTATOR CUFF, OPEN
Anesthesia: General | Site: Shoulder | Laterality: Right

## 2016-04-02 DIAGNOSIS — E785 Hyperlipidemia, unspecified: Secondary | ICD-10-CM

## 2016-04-02 HISTORY — DX: Hyperlipidemia, unspecified: E78.5

## 2017-03-07 ENCOUNTER — Ambulatory Visit
Admission: RE | Admit: 2017-03-07 | Discharge: 2017-03-07 | Disposition: A | Discharge status: Home / Self Care | Payer: Self-pay | Source: Ambulatory Visit | Attending: Internal Medicine | Admitting: Internal Medicine

## 2017-03-07 ENCOUNTER — Other Ambulatory Visit: Payer: Self-pay | Admitting: Internal Medicine

## 2017-03-07 DIAGNOSIS — R4702 Dysphasia: Secondary | ICD-10-CM

## 2017-03-08 ENCOUNTER — Ambulatory Visit
Admission: RE | Admit: 2017-03-08 | Discharge: 2017-03-08 | Disposition: A | Discharge status: Home / Self Care | Payer: Medicare Other | Source: Ambulatory Visit | Attending: Internal Medicine | Admitting: Internal Medicine

## 2017-03-08 DIAGNOSIS — R4702 Dysphasia: Secondary | ICD-10-CM

## 2017-03-18 ENCOUNTER — Other Ambulatory Visit: Payer: Self-pay | Admitting: Internal Medicine

## 2017-03-18 ENCOUNTER — Telehealth (HOSPITAL_COMMUNITY): Payer: Self-pay | Admitting: Internal Medicine

## 2017-03-18 DIAGNOSIS — G459 Transient cerebral ischemic attack, unspecified: Secondary | ICD-10-CM

## 2017-03-18 DIAGNOSIS — I6381 Other cerebral infarction due to occlusion or stenosis of small artery: Secondary | ICD-10-CM

## 2017-03-20 ENCOUNTER — Ambulatory Visit (HOSPITAL_COMMUNITY): Payer: Medicare Other | Attending: Internal Medicine

## 2017-03-20 ENCOUNTER — Other Ambulatory Visit: Payer: Self-pay

## 2017-03-20 DIAGNOSIS — I051 Rheumatic mitral insufficiency: Secondary | ICD-10-CM | POA: Diagnosis not present

## 2017-03-20 DIAGNOSIS — I6381 Other cerebral infarction due to occlusion or stenosis of small artery: Secondary | ICD-10-CM

## 2017-03-20 DIAGNOSIS — I420 Dilated cardiomyopathy: Secondary | ICD-10-CM | POA: Insufficient documentation

## 2017-03-20 DIAGNOSIS — G459 Transient cerebral ischemic attack, unspecified: Secondary | ICD-10-CM

## 2017-03-20 DIAGNOSIS — I5030 Unspecified diastolic (congestive) heart failure: Secondary | ICD-10-CM | POA: Diagnosis not present

## 2017-03-20 NOTE — Telephone Encounter (Signed)
User: Trina AoGRIFFIN, Labria Wos A Date/time: 03/18/17 10:38 AM  Comment: Called pt and lmsg for her to CB to get scheduled for an echo.  Context:  Outcome: Left Message  Phone number: [phone] Phone Type: Home Phone  Comm. type: Telephone Call type: Outgoing  Contact: Teresita MaduraKerekes, Juleah D Relation to patient: Self

## 2017-05-16 ENCOUNTER — Encounter: Payer: Self-pay | Admitting: Neurology

## 2017-05-16 ENCOUNTER — Ambulatory Visit: Payer: Medicare Other | Admitting: Neurology

## 2017-05-16 VITALS — BP 135/72 | HR 55 | Ht 64.0 in | Wt 141.5 lb

## 2017-05-16 DIAGNOSIS — G459 Transient cerebral ischemic attack, unspecified: Secondary | ICD-10-CM | POA: Diagnosis not present

## 2017-05-16 DIAGNOSIS — G43009 Migraine without aura, not intractable, without status migrainosus: Secondary | ICD-10-CM | POA: Diagnosis not present

## 2017-05-16 MED ORDER — ASPIRIN EC 81 MG PO TBEC
81.0000 mg | DELAYED_RELEASE_TABLET | Freq: Every day | ORAL | 0 refills | Status: DC
Start: 2017-05-16 — End: 2023-11-07

## 2017-05-16 MED ORDER — ROSUVASTATIN CALCIUM 5 MG PO TABS: 5.0000 mg | ORAL_TABLET | Freq: Every day | ORAL | 3 refills | Status: DC

## 2017-05-16 NOTE — Patient Instructions (Signed)
I had a long discussion with the patient with regards to her episodes of visual disturbance and transient speech difficulties likely representing a typical migraine. She also has abnormal MRI scan showing nonspecific white matter hyperintensities which may represent small vessel disease. I agree with starting aspirin 81 mg daily and aggressive control of lipids with LDL goal below 70 mg percent. Start Crestor daily as prescribed. Check MRA of the brain and neck, hemoglobin A1c and lipid profile. I advised her to maintain strict control of hypertension with blood pressure goal below 130/90, the diabetes with him about an A1c goal below 6.5%. She was encouraged to eat a healthy diet with lots of vegetables and fruits and whole gr and cereals and to be active and exercise regularly. She will return for follow-up in 2 months or call earlier if necessary  Stroke Prevention Some medical conditions and behaviors are associated with a higher chance of having a stroke. You can help prevent a stroke by making nutrition, lifestyle, and other changes, including managing any medical conditions you may have. What nutrition changes can be made?  Eat healthy foods. You can do this by: ? Choosing foods high in fiber, such as fresh fruits and vegetables and whole grains. ? Eating at least 5 or more servings of fruits and vegetables a day. Try to fill half of your plate at each meal with fruits and vegetables. ? Choosing lean protein foods, such as lean cuts of meat, poultry without skin, fish, tofu, beans, and nuts. ? Eating low-fat dairy products. ? Avoiding foods that are high in salt (sodium). This can help lower blood pressure. ? Avoiding foods that have saturated fat, trans fat, and cholesterol. This can help prevent high cholesterol. ? Avoiding processed and premade foods.  Follow your health care provider's specific guidelines for losing weight, controlling high blood pressure (hypertension), lowering high  cholesterol, and managing diabetes. These may include: ? Reducing your daily calorie intake. ? Limiting your daily sodium intake to 1,500 milligrams (mg). ? Using only healthy fats for cooking, such as olive oil, canola oil, or sunflower oil. ? Counting your daily carbohydrate intake. What lifestyle changes can be made?  Maintain a healthy weight. Talk to your health care provider about your ideal weight.  Get at least 30 minutes of moderate physical activity at least 5 days a week. Moderate activity includes brisk walking, biking, and swimming.  Do not use any products that contain nicotine or tobacco, such as cigarettes and e-cigarettes. If you need help quitting, ask your health care provider. It may also be helpful to avoid exposure to secondhand smoke.  Limit alcohol intake to no more than 1 drink a day for nonpregnant women and 2 drinks a day for men. One drink equals 12 oz of beer, 5 oz of wine, or 1 oz of hard liquor.  Stop any illegal drug use.  Avoid taking birth control pills. Talk to your health care provider about the risks of taking birth control pills if: ? You are over 28 years old. ? You smoke. ? You get migraines. ? You have ever had a blood clot. What other changes can be made?  Manage your cholesterol levels. ? Eating a healthy diet is important for preventing high cholesterol. If cholesterol cannot be managed through diet alone, you may also need to take medicines. ? Take any prescribed medicines to control your cholesterol as told by your health care provider.  Manage your diabetes. ? Eating a healthy diet and exercising  regularly are important parts of managing your blood sugar. If your blood sugar cannot be managed through diet and exercise, you may need to take medicines. ? Take any prescribed medicines to control your diabetes as told by your health care provider.  Control your hypertension. ? To reduce your risk of stroke, try to keep your blood pressure  below 130/80. ? Eating a healthy diet and exercising regularly are an important part of controlling your blood pressure. If your blood pressure cannot be managed through diet and exercise, you may need to take medicines. ? Take any prescribed medicines to control hypertension as told by your health care provider. ? Ask your health care provider if you should monitor your blood pressure at home. ? Have your blood pressure checked every year, even if your blood pressure is normal. Blood pressure increases with age and some medical conditions.  Get evaluated for sleep disorders (sleep apnea). Talk to your health care provider about getting a sleep evaluation if you snore a lot or have excessive sleepiness.  Take over-the-counter and prescription medicines only as told by your health care provider. Aspirin or blood thinners (antiplatelets or anticoagulants) may be recommended to reduce your risk of forming blood clots that can lead to stroke.  Make sure that any other medical conditions you have, such as atrial fibrillation or atherosclerosis, are managed. What are the warning signs of a stroke? The warning signs of a stroke can be easily remembered as BEFAST.  B is for balance. Signs include: ? Dizziness. ? Loss of balance or coordination. ? Sudden trouble walking.  E is for eyes. Signs include: ? A sudden change in vision. ? Trouble seeing.  F is for face. Signs include: ? Sudden weakness or numbness of the face. ? The face or eyelid drooping to one side.  A is for arms. Signs include: ? Sudden weakness or numbness of the arm, usually on one side of the body.  S is for speech. Signs include: ? Trouble speaking (aphasia). ? Trouble understanding.  T is for time. ? These symptoms may represent a serious problem that is an emergency. Do not wait to see if the symptoms will go away. Get medical help right away. Call your local emergency services (911 in the [place] Do not drive yourself  to the hospital.  Other signs of stroke may include: ? A sudden, severe headache with no known cause. ? Nausea or vomiting. ? Seizure.  Where to find more information: For more information, visit:  American Stroke Association: www.strokeassociation.org  National Stroke Association: www.stroke.org  Summary  You can prevent a stroke by eating healthy, exercising, not smoking, limiting alcohol intake, and managing any medical conditions you may have.  Do not use any products that contain nicotine or tobacco, such as cigarettes and e-cigarettes. If you need help quitting, ask your health care provider. It may also be helpful to avoid exposure to secondhand smoke.  Remember BEFAST for warning signs of stroke. Get help right away if you or a loved one has any of these signs. This information is not intended to replace advice given to you by your health care provider. Make sure you discuss any questions you have with your health care provider. Document Released: 04/26/2004 Document Revised: 04/24/2016 Document Reviewed: 04/24/2016 Elsevier Interactive Patient Education  [employer]2018 Elsevier Inc.

## 2017-05-16 NOTE — Progress Notes (Signed)
[facility] [address] [place]. [place] [address] [phone]       OFFICE CONSULT NOTE  Erica Chase Date of Birth:  [birth year] Medical Record Number:  [id number]   Referring MD:  Rodrigo Ran Reason for Referral:  TIA HPI: Erica Chase is a pleasant 72 year old Caucasian lady is referred for evaluation for episode of possible TIA. She states for the last year one year she has had several episodes of transient vision dysfunction when her vision particularly on the right side is watery and this lasts for about 10-15 minutes and improves. She is unable to identify any specific triggers or relieving factors. There is no headache prior to, during or after these episodes. She has had a couple of migraine headaches in the past few years ago which was severe enough for her to lie down and had slight and sound sensitivity and nausea. Patient said that she had one such episode in early December 2018 when she was at [facility] [address] and was at [employer]. She had trouble giving the phone number and appeared to be confused and her speech was not right. This lasted nearly a minute or so eventually she was able to complete the transection. She does admit that she had this episode of transient vision disturbance a short while earlier. She denies any further episodes since then. She has no known history of strokes TIAs seizures her prior neurological problems. She had recent workup done by Dr. Waynard Edwards which included an MRI scan of the brain on 03/08/2017 which I personally reviewed shows tiny nonspecific hyperintensities in the cerebellar vermis and right cerebellar hemisphere which are not seen on the previous MRI from 07/03/2013. This could represent silent infarcts as patient denies categorically symptoms of incoordination or gait difficulties. The patient also had outpatient carotid ultrasound which have reviewed showed no significant extracranial stenosis as well as transthoracic echo which  showed normal ejection fraction. She had lipid profile which showed elevated LDL 112/14/18. She was started on Crestor as well as aspirin 81 mg but she states she has not taken both the medications yet. She has been watching her diet and plans to have lipid profile repeated to see if she needs the medications. She has no family history of migraines strokes or neurological problems. She denies any prior history of strokes or TIAs. She denies any history of atrial fibrillation, palpitations or cardiac disease. She has a remote history of syncope years ago following a painful dental procedure.  ROS:   14 system review of systems is positive for  vision difficulties, speech difficulties, weight gain, confusion, slurred speech and all other systems negative PMH:  Past Medical History:  Diagnosis Date  . Abdominal pain 2004  . ADD (attention deficit disorder) 08/2010  . Allergic rhinitis 01/2011  . Asthma    unsure-,reflux ruled out using nasonex and pulmocort  . Atypical endocervical cells on Pap smear 1999  . 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 FHx: cancer   . HSV infection 1997  . Hypothyroidism   . Libido, decreased 2011  . Menopausal symptoms 2010  . Orgasmic dysfunction 2006  . Pelvic relaxation 12/2000  . PONV (postoperative nausea and vomiting)    scop patch has worked well in past  . Sinusitis 01/2011  . Stroke [facility])     Social History:  Social History   Socioeconomic History  . Marital status: Divorced    Spouse name: Not on file  .  Number of children: Not on file  . Years of education: Not on file  . Highest education level: Not on file  Social Needs  . Financial resource strain: Not on file  . Food insecurity - worry: Not on file  . Food insecurity - inability: Not on file  . Transportation needs - medical: Not on file  . Transportation needs - non-medical: Not on file  Occupational History  . Not on file  Tobacco Use    . Smoking status: Former Smoker    Last attempt to quit: 02/28/1969    Years since quitting: 48.2  . Smokeless tobacco: Never Used  Substance and Sexual Activity  . Alcohol use: Yes    Comment: social vodka  . Drug use: No  . Sexual activity: No  Other Topics Concern  . Not on file  Social History Narrative  . Not on file    Medications:   Current Outpatient Medications on File Prior to Visit  Medication Sig Dispense Refill  . acyclovir ointment (ZOVIRAX) 5 % Apply 1 application topically as needed.    . Cyanocobalamin (HM VITAMIN B-12 ULTRA STRENGTH) 5000 MCG TBDP Take 5 mLs by mouth.    . fluticasone (FLONASE) 50 MCG/ACT nasal spray Place into both nostrils as needed for allergies or rhinitis.    Marland Kitchen levothyroxine (SYNTHROID, LEVOTHROID) 25 MCG tablet Take 40 mcg by mouth daily.     . Multiple Vitamins-Calcium (ONE-A-DAY WOMENS PO) Take by mouth.    . OMEGA-3 FATTY ACIDS PO Take by mouth.     . ondansetron (ZOFRAN) 8 MG tablet ondansetron hcl 8 mg tabs    . Probiotic Product (PROBIOTIC PO) Take by mouth.     . Ubiquinol 100 MG CAPS Take by mouth.    Marland Kitchen UNABLE TO FIND Tumeric takes as needed    . UNABLE TO FIND Joint care (herbal blend) take daily    . UNABLE TO FIND Earth with [employer] work daily    . UNABLE TO FIND CBD 1/92m in 8oz water    . UNABLE TO FIND boswellia    . UNABLE TO FIND Ashwaganda take daily     No current facility-administered medications on file prior to visit.     Allergies:   Allergies  Allergen Reactions  . Clarithromycin     rash  . Penicillins     Physical Exam General: Pleasant elderly Caucasian lady seated, in no evident distress Head: head normocephalic and atraumatic.   Neck: supple with no carotid or supraclavicular bruits Cardiovascular: regular rate and rhythm, no murmurs Musculoskeletal: no deformity Skin:  no rash/petichiae Vascular:  Normal pulses all extremities  Neurologic Exam Mental Status: Awake and fully alert. Oriented  to place and time. Recent and remote memory intact. Attention span, concentration and fund of knowledge appropriate. Mood and affect appropriate.  Cranial Nerves: Fundoscopic exam reveals sharp disc margins. Pupils equal, briskly reactive to light. Extraocular movements full without nystagmus. Visual fields full to confrontation. Hearing intact. Facial sensation intact. Face, tongue, palate moves normally and symmetrically.  Motor: Normal bulk and tone. Normal strength in all tested extremity muscles. Sensory.: intact to touch , pinprick , position and vibratory sensation.  Coordination: Rapid alternating movements normal in all extremities. Finger-to-nose and heel-to-shin performed accurately bilaterally. Gait and Station: Arises from chair without difficulty. Stance is normal. Gait demonstrates normal stride length and balance . Able to heel, toe and tandem walk without difficulty.  Reflexes: 1+ and symmetric. Toes downgoing.   NIHSS  0 Modified Rankin  0   ASSESSMENT: 72 year old Caucasian lady with transient episode of visual dysfunction followed by speech difficulties and confusion possible complicated migraine episode versus posterior circulation TIA. Abnormal MRI scan of the brain showing tiny punctate cerebellar hyperintensities possibly silent infarcts No significant vascular risk factors except mild hyperlipidemia and history of visual migraines    PLAN: I had a long discussion with the patient with regards to her episodes of visual disturbance and transient speech difficulties likely representing a typical migraine. She also has abnormal MRI scan showing nonspecific white matter hyperintensities which may represent small vessel disease. I agree with starting aspirin 81 mg daily and aggressive control of lipids with LDL goal below 70 mg percent. Start Crestor daily as prescribed. Check MRA of the brain and neck, hemoglobin A1c and lipid profile. I advised her to maintain strict control of  hypertension with blood pressure goal below 130/90, the diabetes with him about an A1c goal below 6.5%. She was encouraged to eat a healthy diet with lots of vegetables and fruits and whole gr and cereals and to be active and exercise regularly. Greater than 50% time during this 45 minute consultation visit was spent on counseling and coordination of care about her typical migraine, TIA and discussion about stroke prevention and answering questions She will return for follow-up in 2 months or call earlier if necessary Delia HeadyPramod Sethi, MD  [facility] [address] [place], [place] [id number]  Phone [phone] Fax [phone] Note: This document was prepared with digital dictation and possible smart phrase technology. Any transcriptional errors that result from this process are unintentional.

## 2017-05-17 ENCOUNTER — Telehealth: Payer: Self-pay | Admitting: Neurology

## 2017-05-17 LAB — LIPID PANEL
Chol/HDL Ratio: 3.7 ratio (ref 0.0–4.4)
Cholesterol, Total: 220 mg/dL — ABNORMAL HIGH (ref 100–199)
HDL: 59 mg/dL (ref >39)
LDL Calculated: 137 mg/dL — ABNORMAL HIGH (ref 0–99)
Triglycerides: 121 mg/dL (ref 0–149)
VLDL Cholesterol Cal: 24 mg/dL (ref 5–40)

## 2017-05-17 LAB — SEDIMENTATION RATE: Sed Rate: 13 mm/hr (ref 0–40)

## 2017-05-17 LAB — HEMOGLOBIN A1C
Est. average glucose Bld gHb Est-mCnc: 108 mg/dL
Hgb A1c MFr Bld: 5.4 % (ref 4.8–5.6)

## 2017-05-17 NOTE — Telephone Encounter (Signed)
The patient and gave him results of lab work stating ESR, hemoglobin A1c was normal but LDL cholesterol was not satisfactory in elevated at 137 mg percent. I encouraged her to use start taking Crestor as it has been prescribed but she was trying diet alone. She was understanding. She was advised to keep her follow-up appointment with me in 2 months

## 2017-05-17 NOTE — Telephone Encounter (Signed)
Pt request labs results. Please call to advise. Pt is aware the clinic closes at noon

## 2017-05-23 ENCOUNTER — Ambulatory Visit
Admission: RE | Admit: 2017-05-23 | Discharge: 2017-05-23 | Disposition: A | Discharge status: Home / Self Care | Payer: Medicare Other | Source: Ambulatory Visit | Attending: Neurology | Admitting: Neurology

## 2017-05-23 DIAGNOSIS — G43009 Migraine without aura, not intractable, without status migrainosus: Secondary | ICD-10-CM | POA: Diagnosis not present

## 2017-05-23 MED ORDER — GADOBENATE DIMEGLUMINE 529 MG/ML IV SOLN: 13.0000 mL | Freq: Once | INTRAVENOUS | Status: DC | PRN

## 2017-05-27 ENCOUNTER — Encounter: Payer: Self-pay | Admitting: Neurology

## 2017-05-29 ENCOUNTER — Telehealth: Payer: Self-pay

## 2017-05-29 NOTE — Telephone Encounter (Signed)
Notes recorded by Hildred AlaminMurrell, Katrina Y, RN on 05/29/2017 at 10:19 AM EST Rn call patient about her MRA head, and MR neck results. The MR neck shows no significant blockages or narrowing of the arteries in the neck. MRA brain shows no major blockages or narrowing. There is a minor outpouching of one of the blood vessels but nothing to worry about. RN stated per Dr.Sethi he can show her the scan and discuss if a repeat is needed in 6 months or 3 months. Pt verbalized understanding

## 2017-05-29 NOTE — Telephone Encounter (Signed)
Rn was on the phone discussing her images for neck,and head. PT stated since taking the 5mg  crestor on 05/16/2017 she has notice her knees are achy,and sore.ALso she has notice left side of back is sore. Rn stated a message will be sent to [facility] about these issues. Pt stated if she does not pick up to leave a detail message on her phone.

## 2017-05-29 NOTE — Telephone Encounter (Signed)
-----   Message from Micki RileyPramod S Sethi, MD sent at 05/29/2017  8:37 AM EST ----- Kindly inform the patient that MR angiogram of the neck shows no significant blockages on narrowing of the arteries in the neck. MRA of the brain vessels also did not show any major blockages on narrowing. There is a minor outpouching of one of the blood vessels but nothing to worry about.

## 2017-05-29 NOTE — Telephone Encounter (Signed)
Rn spoke with Shanda BumpsJessica NP who prescribed the pts crestor 5mg  daily. Rn pt pt reported left side back is sore,and knees are achy an sore since taking the medication which was started 05/16/2017. Per Shanda BumpsJessica she recommend pt : 1.Continue the 5mg  crestor because of her  Elevated lipid panel results 2. Also she wants pt to start taking CoQ10 which is Co Coenzyme 10  over the counter at 200mg  daily together with the crestor 5mg .

## 2017-05-30 ENCOUNTER — Encounter: Payer: Self-pay | Admitting: Neurology

## 2017-05-30 NOTE — Telephone Encounter (Signed)
Called patient back regarding possible back and knee pain on Crestor 5mg  which was prescribed 2 weeks ago. Patient is currently taking an active form of CoQ10 100mg . Reccommended patient increase this dose to 200mg  daily. Reviewed latest lipid results with patient as LDL at 136 and stressed importance of medication. Patient agreed to continue to take Crestor 5mg  as her pain is not constant, does not stop her from doing her daily activities, and is relieved by heat or stretching. Patient was instructed to call back if she has further concerns, pain worsens, or if pain does not subside after a couple months of taking medication. Patient was in agreement and thankful for the call.

## 2017-05-30 NOTE — Telephone Encounter (Signed)
Rn call patient back about her taking COq`10 200mg  OTC along with 5 mg of crestor. RN stated per Shanda BumpsJessica NP this should help with the soreness in the back. The pt stated it's more in her back than her knees. PT stated she is already taking a supplement similar to Coq10 which is on her medication list. Rn stated [facility]. PT verbalized understanding.

## 2017-07-07 ENCOUNTER — Emergency Department (HOSPITAL_COMMUNITY)
Admission: EM | Admit: 2017-07-07 | Discharge: 2017-07-07 | Disposition: A | Discharge status: Home / Self Care | Payer: Medicare Other | Source: Home / Self Care

## 2017-07-07 ENCOUNTER — Emergency Department (HOSPITAL_BASED_OUTPATIENT_CLINIC_OR_DEPARTMENT_OTHER)
Admission: EM | Admit: 2017-07-07 | Discharge: 2017-07-07 | Disposition: A | Discharge status: Home / Self Care | Payer: Medicare Other | Attending: Emergency Medicine | Admitting: Emergency Medicine

## 2017-07-07 ENCOUNTER — Emergency Department (HOSPITAL_BASED_OUTPATIENT_CLINIC_OR_DEPARTMENT_OTHER): Payer: Medicare Other

## 2017-07-07 ENCOUNTER — Encounter (HOSPITAL_COMMUNITY): Payer: Self-pay | Admitting: Emergency Medicine

## 2017-07-07 ENCOUNTER — Encounter (HOSPITAL_BASED_OUTPATIENT_CLINIC_OR_DEPARTMENT_OTHER): Payer: Self-pay | Admitting: Adult Health

## 2017-07-07 ENCOUNTER — Other Ambulatory Visit: Payer: Self-pay

## 2017-07-07 DIAGNOSIS — Z7982 Long term (current) use of aspirin: Secondary | ICD-10-CM | POA: Insufficient documentation

## 2017-07-07 DIAGNOSIS — Z87891 Personal history of nicotine dependence: Secondary | ICD-10-CM | POA: Diagnosis not present

## 2017-07-07 DIAGNOSIS — Z5321 Procedure and treatment not carried out due to patient leaving prior to being seen by health care provider: Secondary | ICD-10-CM | POA: Insufficient documentation

## 2017-07-07 DIAGNOSIS — Z79899 Other long term (current) drug therapy: Secondary | ICD-10-CM | POA: Insufficient documentation

## 2017-07-07 DIAGNOSIS — R1084 Generalized abdominal pain: Secondary | ICD-10-CM

## 2017-07-07 DIAGNOSIS — R1032 Left lower quadrant pain: Secondary | ICD-10-CM | POA: Insufficient documentation

## 2017-07-07 DIAGNOSIS — E039 Hypothyroidism, unspecified: Secondary | ICD-10-CM | POA: Insufficient documentation

## 2017-07-07 DIAGNOSIS — Z8673 Personal history of transient ischemic attack (TIA), and cerebral infarction without residual deficits: Secondary | ICD-10-CM | POA: Diagnosis not present

## 2017-07-07 DIAGNOSIS — R109 Unspecified abdominal pain: Secondary | ICD-10-CM

## 2017-07-07 DIAGNOSIS — J45909 Unspecified asthma, uncomplicated: Secondary | ICD-10-CM | POA: Diagnosis not present

## 2017-07-07 LAB — URINALYSIS, ROUTINE W REFLEX MICROSCOPIC
Bilirubin Urine: NEGATIVE
Glucose, UA: NEGATIVE mg/dL
Hgb urine dipstick: NEGATIVE
Ketones, ur: NEGATIVE mg/dL
Leukocytes, UA: NEGATIVE
Nitrite: NEGATIVE
Protein, ur: NEGATIVE mg/dL
Specific Gravity, Urine: 1.01 (ref 1.005–1.030)
pH: 6 (ref 5.0–8.0)

## 2017-07-07 LAB — COMPREHENSIVE METABOLIC PANEL
ALT: 32 U/L (ref 14–54)
AST: 41 U/L (ref 15–41)
Albumin: 3.9 g/dL (ref 3.5–5.0)
Alkaline Phosphatase: 56 U/L (ref 38–126)
Anion gap: 10 (ref 5–15)
BUN: 18 mg/dL (ref 6–20)
CO2: 24 mmol/L (ref 22–32)
Calcium: 9.5 mg/dL (ref 8.9–10.3)
Chloride: 105 mmol/L (ref 101–111)
Creatinine, Ser: 0.7 mg/dL (ref 0.44–1.00)
GFR calc Af Amer: >60 mL/min (ref >60)
GFR calc non Af Amer: >60 mL/min (ref >60)
Glucose, Bld: 114 mg/dL — ABNORMAL HIGH (ref 65–99)
Potassium: 3.9 mmol/L (ref 3.5–5.1)
Sodium: 139 mmol/L (ref 135–145)
Total Bilirubin: 1.1 mg/dL (ref 0.3–1.2)
Total Protein: 7.3 g/dL (ref 6.5–8.1)

## 2017-07-07 LAB — CBC
HCT: 40.4 % (ref 36.0–46.0)
Hemoglobin: 13.3 g/dL (ref 12.0–15.0)
MCH: 30 pg (ref 26.0–34.0)
MCHC: 32.9 g/dL (ref 30.0–36.0)
MCV: 91.2 fL (ref 78.0–100.0)
Platelets: 272 10*3/uL (ref 150–400)
RBC: 4.43 MIL/uL (ref 3.87–5.11)
RDW: 13.7 % (ref 11.5–15.5)
WBC: 5.8 10*3/uL (ref 4.0–10.5)

## 2017-07-07 LAB — LIPASE, BLOOD: Lipase: 29 U/L (ref 11–51)

## 2017-07-07 MED ORDER — TRAMADOL HCL 50 MG PO TABS: 50.0000 mg | ORAL_TABLET | Freq: Four times a day (QID) | ORAL | 0 refills | Status: DC | PRN

## 2017-07-07 MED ORDER — FENTANYL CITRATE (PF) 100 MCG/2ML IJ SOLN
25.0000 ug | Freq: Once | INTRAMUSCULAR | Status: AC
  Administered 2017-07-07: 25 ug via INTRAVENOUS
  Filled 2017-07-07: qty 2

## 2017-07-07 MED ORDER — KETOROLAC TROMETHAMINE 30 MG/ML IJ SOLN
30.0000 mg | Freq: Once | INTRAMUSCULAR | Status: AC
  Administered 2017-07-07: 30 mg via INTRAVENOUS
  Filled 2017-07-07: qty 1

## 2017-07-07 NOTE — ED Notes (Signed)
Patient transported to CT 

## 2017-07-07 NOTE — ED Triage Notes (Signed)
Pt c/o mid lower abd pain "behind pubic bone area" that started this morning. Pt reports that pain came on and after putting heat pain did subside. But pain has been constant over past little while.  Denies any pain or blood in urine.

## 2017-07-07 NOTE — ED Provider Notes (Signed)
[facility] EMERGENCY DEPARTMENT Provider Note   CSN: [id number] Arrival date & time: 07/07/17  1627     History   Chief Complaint Chief Complaint  Patient presents with  . Abdominal Pain    HPI Erica Chase is a 72 y.o. female.  Patient is a 72 year old female who presents with abdominal pain.  She states at 945 this morning she had a sudden onset of pain in her suprapubic area.  It is now radiating to her left lower abdomen.  She denies any associated nausea or vomiting.  No back pain.  She describes it as a sharp stabbing pain.  She denies any vaginal pain.  No difficulty with urination.  No known fevers.  No history of similar symptoms in the past.  She is having normal bowel movements.     Past Medical History:  Diagnosis Date  . Abdominal pain 2004  . ADD (attention deficit disorder) 08/2010  . Allergic rhinitis 01/2011  . Asthma    unsure-,reflux ruled out using nasonex and pulmocort  . Atypical endocervical cells on Pap smear 1999  . 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 FHx: cancer   . HSV infection 1997  . Hypothyroidism   . Libido, decreased 2011  . Menopausal symptoms 2010  . Orgasmic dysfunction 2006  . Pelvic relaxation 12/2000  . PONV (postoperative nausea and vomiting)    scop patch has worked well in past  . Sinusitis 01/2011  . Stroke [facility](H[facility])     Patient Active Problem List   Diagnosis Date Noted  . Hypothyroidism 08/28/2011  . Menopausal symptoms 08/28/2011  . Decreased libido 08/28/2011  . Hypercholesterolemia 08/28/2011  . 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 CIN III with severe dysplasia 08/28/2011    Past Surgical History:  Procedure Laterality Date  . CHOLECYSTECTOMY  01/2011  . KNEE ARTHROSCOPY    . KNEE ARTHROSCOPY  03/05/2011   Procedure: ARTHROSCOPY KNEE;  Surgeon: Javier DockerJeffrey C Beane;  Location: [facility];  Service: Orthopedics;  Laterality:  Left;  left knee scope with debridement , Partial lateral meniscectomy  . THUMB ARTHROSCOPY       OB History    Gravida  3   Para      Term      Preterm      AB      Living  3     SAB      TAB      Ectopic      Multiple      Live Births  3            Home Medications    Prior to Admission medications   Medication Sig Start Date End Date Taking? Authorizing Provider  acyclovir ointment (ZOVIRAX) 5 % Apply 1 application topically as needed.    [provider]  aspirin EC 81 MG tablet Take 1 tablet (81 mg total) by mouth daily. 05/16/17   George HughVanschaick, Jessica, NP  Cyanocobalamin (HM VITAMIN B-12 ULTRA STRENGTH) 5000 MCG TBDP Take 5 mLs by mouth.    [provider]  fluticasone (FLONASE) 50 MCG/ACT nasal spray Place into both nostrils as needed for allergies or rhinitis.    [provider]  levothyroxine (SYNTHROID, LEVOTHROID) 25 MCG tablet Take 40 mcg by mouth daily.     [provider]  Multiple Vitamins-Calcium (ONE-A-DAY WOMENS PO) Take by mouth.    [provider]  OMEGA-3 FATTY  ACIDS PO Take by mouth.     [provider]  ondansetron (ZOFRAN) 8 MG tablet ondansetron hcl 8 mg tabs    [provider]  Probiotic Product (PROBIOTIC PO) Take by mouth.     [provider]  rosuvastatin (CRESTOR) 5 MG tablet Take 1 tablet (5 mg total) by mouth daily. 05/16/17   George Hugh, NP  traMADol (ULTRAM) 50 MG tablet Take 1 tablet (50 mg total) by mouth every 6 (six) hours as needed. 07/07/17   Rolan Bucco, MD  Ubiquinol 100 MG CAPS Take by mouth.    [provider]  UNABLE TO FIND Tumeric takes as needed    [provider]  UNABLE TO FIND Joint care (herbal blend) take daily    [provider]  UNABLE TO FIND Earth with [employer] work daily    [provider]  UNABLE TO FIND CBD 1/48m in 8oz water    [provider]  UNABLE TO FIND boswellia    [provider]  UNABLE TO FIND Ashwaganda take daily    [provider]    Family History Family History  Problem Relation Age of Onset  . Hearing loss Brother     Social History Social History   Tobacco Use  . Smoking status: Former Smoker    Last attempt to quit: 02/28/1969    Years since quitting: 48.3  . Smokeless tobacco: Never Used  Substance Use Topics  . Alcohol use: Yes    Comment: social vodka  . Drug use: No     Allergies   Clarithromycin and Penicillins   Review of Systems Review of Systems  Constitutional: Negative for chills, diaphoresis, fatigue and fever.  HENT: Negative for congestion, rhinorrhea and sneezing.   Eyes: Negative.   Respiratory: Negative for cough, chest tightness and shortness of breath.   Cardiovascular: Negative for chest pain and leg swelling.  Gastrointestinal: Positive for abdominal pain. Negative for blood in stool, diarrhea, nausea and vomiting.  Genitourinary: Negative for difficulty urinating, flank pain, frequency and hematuria.  Musculoskeletal: Negative for arthralgias and back pain.  Skin: Negative for rash.  Neurological: Negative for dizziness, speech difficulty, weakness, numbness and headaches.     Physical Exam Updated Vital Signs BP (!) 158/76 (BP Location: Left Arm)   Pulse 67   Temp 97.7 F (36.5 C) (Oral)   Resp 16   Ht 5\' 4"  (1.626 m)   Wt 61.7 kg (136 lb)   SpO2 98%   BMI 23.34 kg/m   Physical Exam  Constitutional: She is oriented to person, place, and time. She appears well-developed and well-nourished.  HENT:  Head: Normocephalic and atraumatic.  Eyes: Pupils are equal, round, and reactive to light.  Neck: Normal range of motion. Neck supple.  Cardiovascular: Normal rate, regular rhythm and normal heart sounds.  Pulmonary/Chest: Effort normal and breath sounds normal. No respiratory distress. She has no wheezes. She has no rales. She exhibits no tenderness.  Abdominal: Soft. Bowel  sounds are normal. There is tenderness in the suprapubic area and left lower quadrant. There is no rebound and no guarding.  Musculoskeletal: Normal range of motion. She exhibits no edema.  Lymphadenopathy:    She has no cervical adenopathy.  Neurological: She is alert and oriented to person, place, and time.  Skin: Skin is warm and dry. No rash noted.  Psychiatric: She has a normal mood and affect.     ED Treatments / Results  Labs (all labs  ordered are listed, but only abnormal results are displayed) Labs Reviewed  URINALYSIS, ROUTINE W REFLEX MICROSCOPIC    EKG None  Radiology Ct Renal Stone Study  Result Date: 07/07/2017 CLINICAL DATA:  Presents with constant sharp pain behind pubic bone and into left groin. PT reports, "I gave birth to three children at home with no meds and this, this is the worse pain I have ever felt" PT is tearful. dENies nausea and vomitng. Pain is worse after eating. Blood and urine done at [facility] EXAM: CT ABDOMEN AND PELVIS WITHOUT CONTRAST TECHNIQUE: Multidetector CT imaging of the abdomen and pelvis was performed following the standard protocol without IV contrast. COMPARISON:  None. FINDINGS: Lower chest: No acute abnormality. Hepatobiliary: No focal liver abnormality is seen. Status post cholecystectomy. No biliary dilatation. Pancreas: Unremarkable. No pancreatic ductal dilatation or surrounding inflammatory changes. Spleen: Normal in size without focal abnormality. Adrenals/Urinary Tract: Adrenal glands are unremarkable. Kidneys are normal, without renal calculi, focal lesion, or hydronephrosis. Bladder is unremarkable. Stomach/Bowel: Stomach, small bowel and colon are unremarkable. Appendix not visualized. No evidence of appendicitis. Vascular/Lymphatic: Aortic atherosclerosis. No enlarged abdominal or pelvic lymph nodes. Reproductive: Uterus and bilateral adnexa are unremarkable. Other: No abdominal wall hernia or abnormality. No abdominopelvic ascites.  Musculoskeletal: No fracture or acute finding. No osteoblastic or osteolytic lesions. Disc degenerative changes in the lower lumbar spine. IMPRESSION: 1. No acute findings. No findings to account for the patient's pain. 2. No bowel obstruction or inflammation. 3. No renal or ureteral stones or obstructive uropathy. 4. Mild aortic atherosclerosis. Electronically Signed   By: Amie Portland M.D.   On: 07/07/2017 17:28    Procedures Procedures (including critical care time)  Medications Ordered in ED Medications  fentaNYL (SUBLIMAZE) injection 25 mcg (25 mcg Intravenous Given 07/07/17 1706)  ketorolac (TORADOL) 30 MG/ML injection 30 mg (30 mg Intravenous Given 07/07/17 1744)     Initial Impression / Assessment and Plan / ED Course  I have reviewed the triage vital signs and the nursing notes.  Pertinent labs & imaging results that were available during my care of the patient were reviewed by me and considered in my medical decision making (see chart for details).     Patient is a 72 year old female who presents with suprapubic and left lower quadrant abdominal pain.  She had labs which were within normal limits.  Her CBC is normal.  Her urinalysis shows no signs of infection.  No hematuria.  A CT scan was performed which showed no evidence of kidney stone.  No hernia was noted.  I do not feel a palpable hernia.  She was given medications in the ED and is feeling much better after this.  She has a non-concerning abdominal exam.  She has no vaginal pain.  No inguinal pain.  She was discharged home in good condition.  She was encouraged to follow-up with her PCP within 2 days for recheck.  Return precautions were given.  She was given a prescription for tramadol for pain.  Final Clinical Impressions(s) / ED Diagnoses   Final diagnoses:  Abdominal pain, unspecified abdominal location    ED Discharge Orders        Ordered    traMADol (ULTRAM) 50 MG tablet  Every 6 hours PRN     07/07/17 1825        Rolan Bucco, MD 07/07/17 1827

## 2017-07-07 NOTE — ED Notes (Signed)
[facility] to take pat's name out so they could arrive there.

## 2017-07-07 NOTE — ED Triage Notes (Signed)
PResents with constant sharp pain behind pubic bone and into left gorin. PT reports, "I gave birth to three children at home with no meds and this, this is the worse pain I have ever felt" PT is tearful. dENies nausea and vomitng. Pain is worse after eating. [facility]

## 2017-07-22 ENCOUNTER — Encounter: Payer: Self-pay | Admitting: Neurology

## 2017-07-22 ENCOUNTER — Ambulatory Visit: Payer: Medicare Other | Admitting: Neurology

## 2017-07-22 VITALS — BP 143/56 | HR 56 | Ht 64.0 in | Wt 141.5 lb

## 2017-07-22 DIAGNOSIS — G43109 Migraine with aura, not intractable, without status migrainosus: Secondary | ICD-10-CM | POA: Diagnosis not present

## 2017-07-22 MED ORDER — SIMVASTATIN 20 MG PO TABS: 20.0000 mg | ORAL_TABLET | Freq: Every day | ORAL | 3 refills | Status: DC

## 2017-07-22 NOTE — Progress Notes (Signed)
[facility] [address] [place] [address] [phone]       OFFICE FOLLOW UP VISIT NOTE  Erica. Erica Chase Date of Birth:  1946/03/02 Medical Record Number:  [id number]   Referring MD:  Crist Infante Reason for Referral:  TIA HPI: Initial Consult 05/16/2017 : Erica Chase is a pleasant 72 year old Caucasian lady is referred for evaluation for episode of possible TIA. She states for the last year one year she has had several episodes of transient vision dysfunction when her vision particularly on the right side is watery and this lasts for about 10-15 minutes and improves. She is unable to identify any specific triggers or relieving factors. There is no headache prior to, during or after these episodes. She has had a couple of migraine headaches in the past few years ago which was severe enough for her to lie down and had slight and sound sensitivity and nausea. Patient said that she had one such episode in early December 2018 when she was at [facility] and was at [employer]. She had trouble giving the phone number and appeared to be confused and her speech was not right. This lasted nearly a minute or so eventually she was able to complete the transection. She does admit that she had this episode of transient vision disturbance a short while earlier. She denies any further episodes since then. She has no known history of strokes TIAs seizures her prior neurological problems. She had recent workup done by Dr. Joylene Draft which included an MRI scan of the brain on 03/08/2017 which I personally reviewed shows tiny nonspecific hyperintensities in the cerebellar vermis and right cerebellar hemisphere which are not seen on the previous MRI from 07/03/2013. This could represent silent infarcts as patient denies categorically symptoms of incoordination or gait difficulties. The patient also had outpatient carotid ultrasound which have reviewed showed no significant extracranial stenosis  as well as transthoracic echo which showed normal ejection fraction. She had lipid profile which showed elevated LDL 112/ She was started on Crestor as well as aspirin 81 mg but she states she has not taken both the medications yet. She has been watching her diet and plans to have lipid profile repeated to see if she needs the medications. She has no family history of migraines strokes or neurological problems. She denies any prior history of strokes or TIAs. She denies any history of atrial fibrillation, palpitations or cardiac disease. She has a remote history of syncope years ago following a painful dental procedure. Update 07/22/2017 : She returns for follow-up after last visit 2 months ago. She states she's had no further episodes of TIA or Complicated migraine. She has however had 3 brief episodes of visual migraines without headache or focal neurological symptoms. She is starting aspirin well without bleeding or bruising. She is prescribed Crestor at last visit but stopped it after taking it only for a week complaining of abdominal and back tightness and muscular pain. This improved after she stopped the medicine. She has not tried any other statin so far. She states her blood pressure is quite good at home and she does have white coat hypertension and today's blood pressure is elevated in office in 143/56. She did undergo MRA of the brain and neck on 05/16/17 which have personally reviewed showed no significant extracranial stenosis. The terminal right vertebral artery has diminished caliber unclear as to congenital hypoplasia or mild atherosclerosis. There is a tiny 1-2 mm infundibulum or dilatation of the right  cavernous carotid. Lab work done on 05/16/17 showed LDL cholesterol of 137 mg percent and hemoglobin A1c of 5.4. ESR was 13 mm. ROS:   14 system review of systems is positive for  vision difficulties, ,decrease concentration onlynd all other systems negative PMH:  Past Medical History:  Diagnosis  Date  . Abdominal pain 2004  . ADD (attention deficit disorder) 08/2010  . Allergic rhinitis 01/2011  . Asthma    unsure-,reflux ruled out using nasonex and pulmocort  . Atypical endocervical cells on Pap smear 1999  . 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 FHx: cancer   . HSV infection 1997  . Hypothyroidism   . Libido, decreased 2011  . Menopausal symptoms 2010  . Orgasmic dysfunction 2006  . Pelvic relaxation 12/2000  . PONV (postoperative nausea and vomiting)    scop patch has worked well in past  . Sinusitis 01/2011  . Stroke [facility])     Social History:  Social History   Socioeconomic History  . Marital status: Divorced    Spouse name: Not on file  . Number of children: Not on file  . Years of education: Not on file  . Highest education level: Not on file  Occupational History  . Not on file  Social Needs  . Financial resource strain: Not on file  . Food insecurity:    Worry: Not on file    Inability: Not on file  . Transportation needs:    Medical: Not on file    Non-medical: Not on file  Tobacco Use  . Smoking status: Former Smoker    Last attempt to quit: 02/28/1969    Years since quitting: 48.4  . Smokeless tobacco: Never Used  Substance and Sexual Activity  . Alcohol use: Yes    Comment: social vodka  . Drug use: No  . Sexual activity: Never  Lifestyle  . Physical activity:    Days per week: Not on file    Minutes per session: Not on file  . Stress: Not on file  Relationships  . Social connections:    Talks on phone: Not on file    Gets together: Not on file    Attends religious service: Not on file    Active member of club or organization: Not on file    Attends meetings of clubs or organizations: Not on file    Relationship status: Not on file  . Intimate partner violence:    Fear of current or ex partner: Not on file    Emotionally abused: Not on file    Physically abused: Not on file    Forced  sexual activity: Not on file  Other Topics Concern  . Not on file  Social History Narrative  . Not on file    Medications:   Current Outpatient Medications on File Prior to Visit  Medication Sig Dispense Refill  . acyclovir ointment (ZOVIRAX) 5 % Apply 1 application topically as needed.    Marland Kitchen aspirin EC 81 MG tablet Take 1 tablet (81 mg total) by mouth daily. 1 tablet 0  . bimatoprost (LUMIGAN) 0.03 % ophthalmic solution     . Cyanocobalamin (HM VITAMIN B-12 ULTRA STRENGTH) 5000 MCG TBDP Take 5 mLs by mouth.    . fluticasone (FLONASE) 50 MCG/ACT nasal spray Place into both nostrils as needed for allergies or rhinitis.    Marland Kitchen levothyroxine (SYNTHROID, LEVOTHROID) 25 MCG tablet Take 40 mcg by mouth daily.     Marland Kitchen  Multiple Vitamins-Calcium (ONE-A-DAY WOMENS PO) Take by mouth.    . OMEGA-3 FATTY ACIDS PO Take by mouth.     . ondansetron (ZOFRAN) 8 MG tablet ondansetron hcl 8 mg tabs    . Probiotic Product (PROBIOTIC PO) Take by mouth.     . Ubiquinol 100 MG CAPS Take by mouth.    Marland Kitchen UNABLE TO FIND Tumeric takes as needed    . UNABLE TO FIND Joint care (herbal blend) take daily    . UNABLE TO FIND Earth with [employer] work daily    . UNABLE TO FIND CBD 1/37min [place]    . UNABLE TO FIND boswellia    . UNABLE TO FIND Ashwaganda take daily     No current facility-administered medications on file prior to visit.     Allergies:   Allergies  Allergen Reactions  . Clarithromycin     rash  . Penicillins     Physical Exam General: Pleasant elderly Caucasian lady seated, in no evident distress Head: head normocephalic and atraumatic.   Neck: supple with no carotid or supraclavicular bruits Cardiovascular: regular rate and rhythm, no murmurs Musculoskeletal: no deformity Skin:  no rash/petichiae Vascular:  Normal pulses all extremities  Neurologic Exam Mental Status: Awake and fully alert. Oriented to place and time. Recent and remote memory intact. Attention span, concentration and  fund of knowledge appropriate. Mood and affect appropriate.  Cranial Nerves: Fundoscopic exam reveals sharp disc margins. Pupils equal, briskly reactive to light. Extraocular movements full without nystagmus. Visual fields full to confrontation. Hearing intact. Facial sensation intact. Face, tongue, palate moves normally and symmetrically.  Motor: Normal bulk and tone. Normal strength in all tested extremity muscles. Sensory.: intact to touch , pinprick , position and vibratory sensation.  Coordination: Rapid alternating movements normal in all extremities. Finger-to-nose and heel-to-shin performed accurately bilaterally. Gait and Station: Arises from chair without difficulty. Stance is normal. Gait demonstrates normal stride length and balance . Able to heel, toe and tandem walk without difficulty.  Reflexes: 1+ and symmetric. Toes downgoing.       ASSESSMENT: 72year old Caucasian lady with transient episode of visual dysfunction followed by speech difficulties and confusion possible complicated migraine episode versus posterior circulation TIA. Abnormal MRI scan of the brain showing tiny punctate cerebellar hyperintensities possibly silent infarcts No significant vascular risk factors except mild hyperlipidemia and history of visual migraines    PLAN: I had a long discussion with the patient regarding her episode of TIA is the comminuted migraine and her hyperlipidemia. She was unable to tolerate Crestor but I strongly encouraged her to try Zocor 20 mg daily along with coenzyme Q 10 200 mg daily. If she is unable to tolerate this may need to switch her to the new PC SK 9 inhibitor injections in the future. Continue aspirin for stroke prevention with strict control of hypertension with blood pressure goal below 130/90. Lipids with LDL cholesterol goal below 70 mg percent.Check repeat lipid profile today. Continue to eat a healthy low-fat diet and exercise regularly and not in weight. Continue to  avoid migraine triggers. Migraines are not frequent enough at the present time to justify prophylactic medications. She will return for follow-up in 3 months with nurse practitioner Erica Billowor call earlier if necessary.Greater than 50% time during this 25 minute  visit was spent on counseling and coordination of care about her atypical migraine, TIA and discussion about stroke prevention and answering questions   PAntony Contras MD  [facility]  [address] [place], [place] [address]  Phone [phone] Fax [phone] Note: This document was prepared with digital dictation and possible smart phrase technology. Any transcriptional errors that result from this process are unintentional.

## 2017-07-22 NOTE — Patient Instructions (Signed)
I had a long discussion with the patient regarding her episode of TIA is the comminuted migraine and her hyperlipidemia. She was unable to tolerate Crestor but I strongly encouraged her to try Zocor 20 mg daily along with coenzyme Q 10 200 mg daily. If she is unable to tolerate this may need to switch her to the new PC SK 9 inhibitor injections in the future. Continue aspirin for stroke prevention with strict control of hypertension with blood pressure goal below 130/90. Lipids with LDL cholesterol goal below 70 mg percent.Check repeat lipid profile today. Continue to eat a healthy low-fat diet and exercise regularly and not in weight. Continue to avoid migraine triggers. Migraines are not frequent enough at the present time to justify prophylactic medications. She will return for follow-up in 3 months with nurse practitioner Shanda BumpsJessica or call earlier if necessary.

## 2017-07-23 LAB — LIPID PANEL
Chol/HDL Ratio: 3.6 ratio (ref 0.0–4.4)
Cholesterol, Total: 197 mg/dL (ref 100–199)
HDL: 55 mg/dL (ref >39)
LDL Calculated: 121 mg/dL — ABNORMAL HIGH (ref 0–99)
Triglycerides: 106 mg/dL (ref 0–149)
VLDL Cholesterol Cal: 21 mg/dL (ref 5–40)

## 2017-07-25 ENCOUNTER — Telehealth: Payer: Self-pay

## 2017-07-25 ENCOUNTER — Encounter: Payer: Self-pay | Admitting: Neurology

## 2017-07-25 NOTE — Telephone Encounter (Signed)
Notes recorded by Hildred AlaminMurrell, Nathian Stencil Y, RN on 07/25/2017 at 8:50 AM EDT Left vm for patient to call back about lab work. ------

## 2017-07-25 NOTE — Telephone Encounter (Signed)
-----   Message from Micki RileyPramod S Sethi, MD sent at 07/25/2017  8:09 AM EDT ----- Joneen RoachKindly inform the patient that her bad cholesterol is still elevated and she needs to try Zocor with co-Q 10 and if she is unable to tolerate. She may need the new PCSK 9 inhibitor injections that we talked about.

## 2017-07-26 NOTE — Telephone Encounter (Signed)
DR.SEthi read my last message. Just FYI thanks.

## 2017-07-26 NOTE — Telephone Encounter (Signed)
Notes recorded by Hildred AlaminMurrell, Cormac Wint Y, RN on 07/26/2017 at 9:57 AM EDT Rn call patient about her bad cholesterol still elevated, and Dr.Sethi wants her to try 20 zocor with the CO-Q10. Is able to tolerate. Pt stated she saw the results on mychart but wants to hold off taking zocor for right now. Rn stated a rx was sent to her pharmacy. Pt stated she will call us back if she is decides to take the zocor. She wants to try lowering her LDL with diet controlled. PT verbalized understanding of DR. Sethi recommendation, and knows a message will be sent to Dr. Pearlean BrownieSethi. ------

## 2017-10-21 ENCOUNTER — Ambulatory Visit: Payer: Medicare Other | Admitting: Adult Health

## 2018-08-19 ENCOUNTER — Other Ambulatory Visit: Payer: Self-pay

## 2018-08-19 ENCOUNTER — Other Ambulatory Visit: Payer: Self-pay | Admitting: Internal Medicine

## 2018-08-19 ENCOUNTER — Ambulatory Visit
Admission: RE | Admit: 2018-08-19 | Discharge: 2018-08-19 | Disposition: A | Discharge status: Home / Self Care | Payer: Medicare Other | Source: Ambulatory Visit | Attending: Internal Medicine | Admitting: Internal Medicine

## 2018-08-19 DIAGNOSIS — R918 Other nonspecific abnormal finding of lung field: Secondary | ICD-10-CM

## 2019-03-11 IMAGING — CT CT RENAL STONE PROTOCOL
2 of 4 series · 16 of 46 positions shown, 18 images · non-contrast
Comparison: None.

CLINICAL DATA: Presents with constant sharp pain behind pubic bone
and into left groin. PT reports, "I gave birth to three children at
home with no meds and this, this is the worse pain I have ever felt"
PT is tearful. dENies nausea and vomitng. Pain is worse after
eating. Blood and urine done at [facility]

EXAM:
CT ABDOMEN AND PELVIS WITHOUT CONTRAST
TECHNIQUE: Multidetector CT imaging of the abdomen and pelvis was performed
following the standard protocol without IV contrast.

[Series 2: axial st · axial · 0.70mm/px · z∈[-622,-242]mm · 13 of 84 slices shown, 15 images]
[im 4/84  soft-tissue]
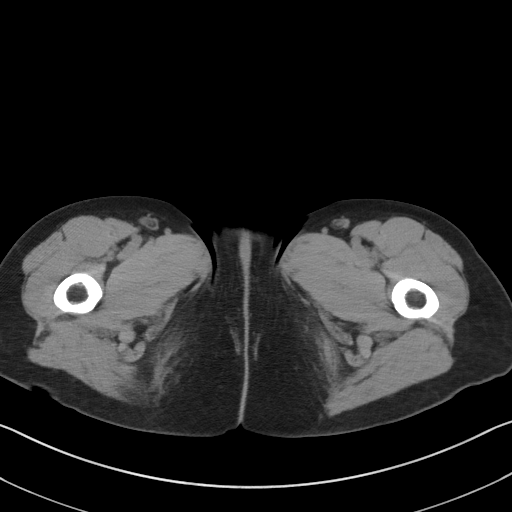
[im 4/84  bone]
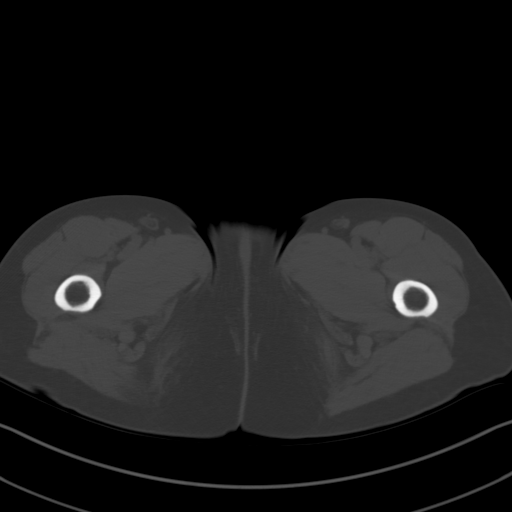
[im 10/84  soft-tissue]
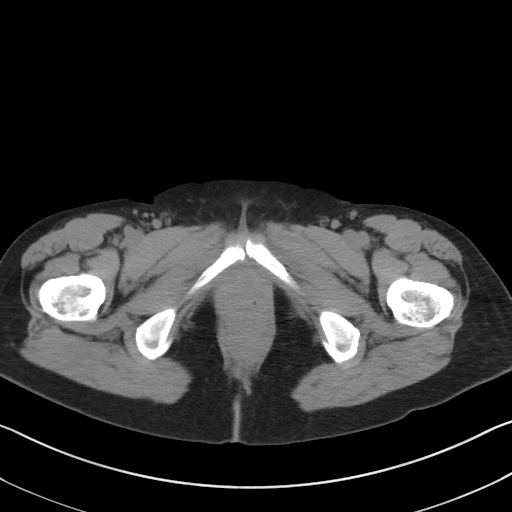
[im 17/84  soft-tissue]
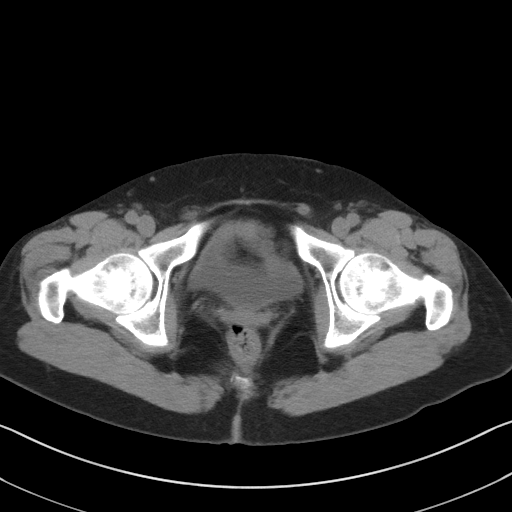
[im 24/84  soft-tissue]
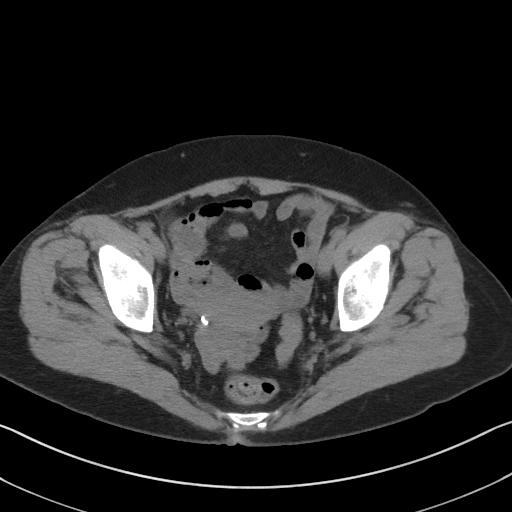
[im 30/84  soft-tissue]
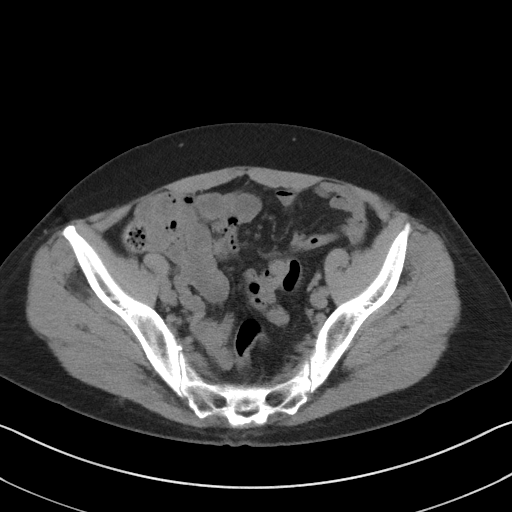
[im 37/84  soft-tissue]
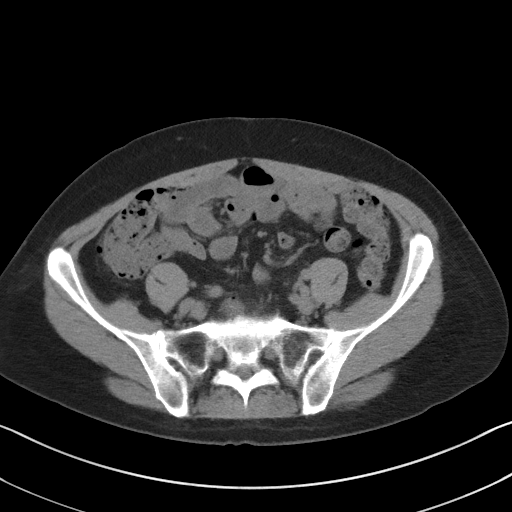
[im 44/84  soft-tissue]
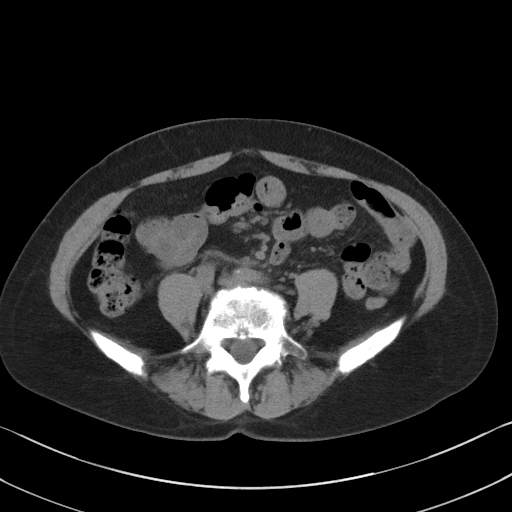
[im 47/84  soft-tissue]
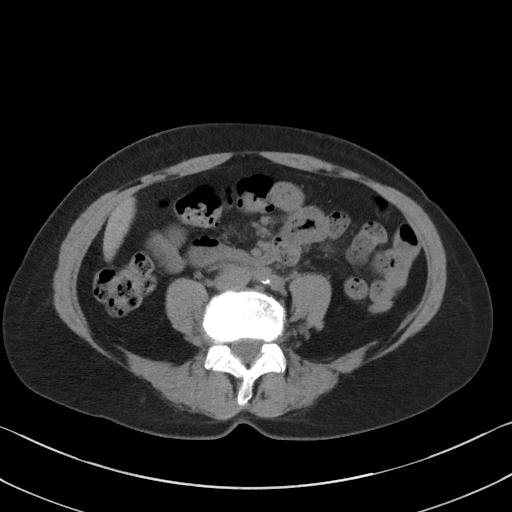
[im 54/84  soft-tissue]
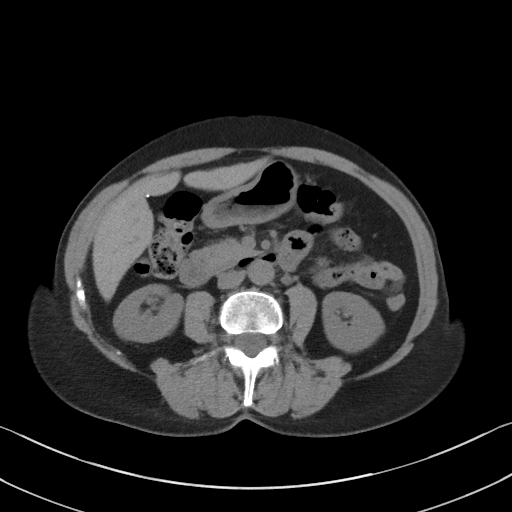
[im 54/84  bone]
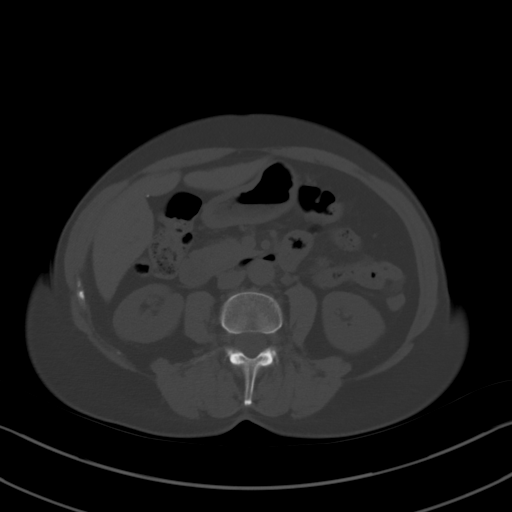
[im 60/84  soft-tissue]
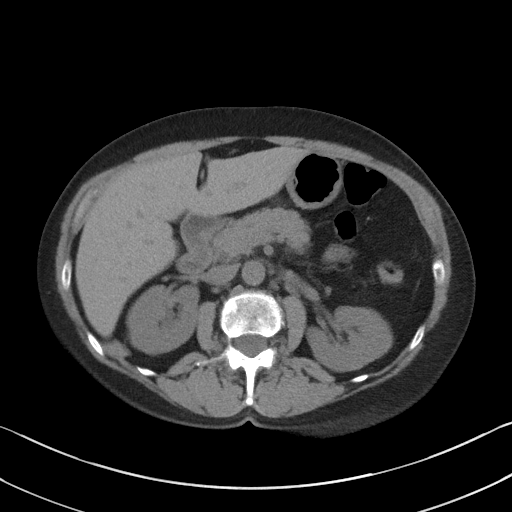
[im 67/84  soft-tissue]
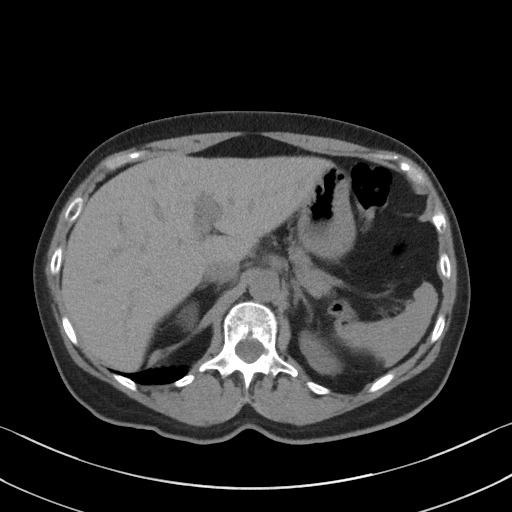
[im 74/84  soft-tissue]
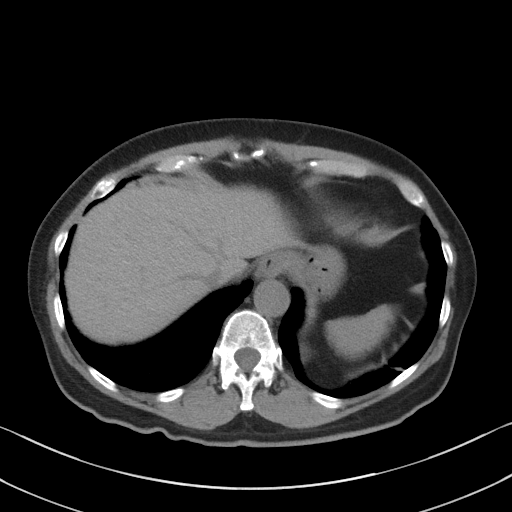
[im 80/84  soft-tissue]
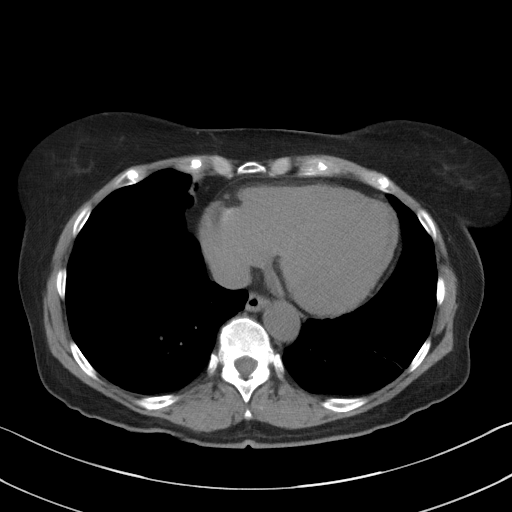

[Series 5: coronal st · coronal · 0.68mm/px · 3 of 77 slices shown]
[im 26/77  soft-tissue]
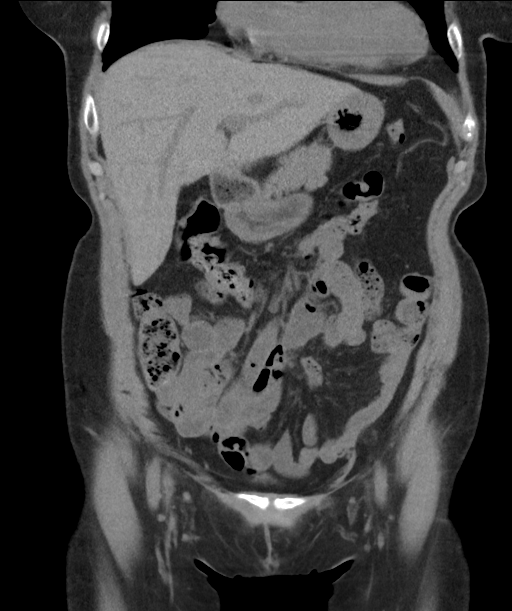
[im 34/77  soft-tissue]
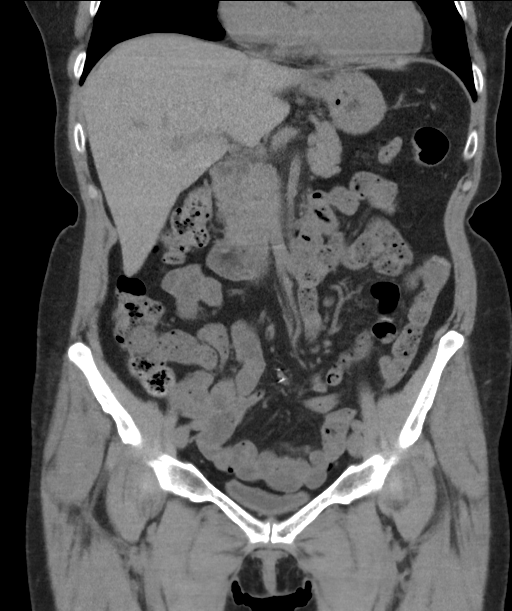
[im 43/77  soft-tissue]
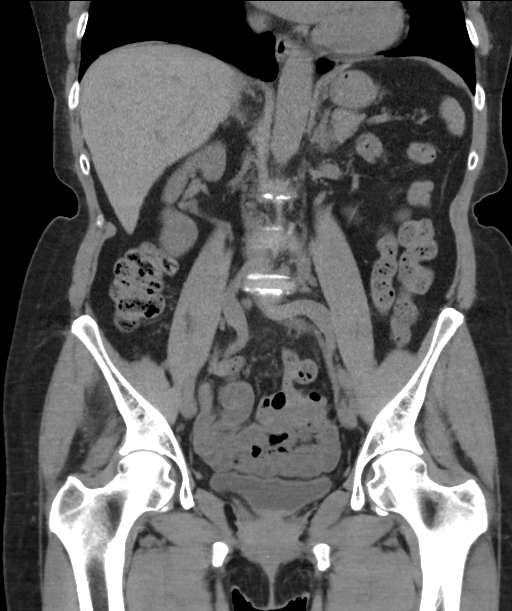

[16 of 46 positions shown; findings below may reference images not displayed]

FINDINGS: Lower chest: No acute abnormality.

Hepatobiliary: No focal liver abnormality is seen. Status post
cholecystectomy. No biliary dilatation.

Pancreas: Unremarkable. No pancreatic ductal dilatation or
surrounding inflammatory changes.

Spleen: Normal in size without focal abnormality.

Adrenals/Urinary Tract: Adrenal glands are unremarkable. Kidneys are
normal, without renal calculi, focal lesion, or hydronephrosis.
Bladder is unremarkable.

Stomach/Bowel: Stomach, small bowel and colon are unremarkable.
Appendix not visualized. No evidence of appendicitis.

Vascular/Lymphatic: Aortic atherosclerosis. No enlarged abdominal or
pelvic lymph nodes.

Reproductive: Uterus and bilateral adnexa are unremarkable.

Other: No abdominal wall hernia or abnormality. No abdominopelvic
ascites.

Musculoskeletal: No fracture or acute finding. No osteoblastic or
osteolytic lesions. Disc degenerative changes in the lower lumbar
spine.
IMPRESSION: 1. No acute findings. No findings to account for the patient's pain.
2. No bowel obstruction or inflammation.
3. No renal or ureteral stones or obstructive uropathy.
4. Mild aortic atherosclerosis.

## 2019-07-20 ENCOUNTER — Encounter: Payer: Self-pay | Admitting: Cardiovascular Disease

## 2019-07-20 ENCOUNTER — Other Ambulatory Visit: Payer: Self-pay

## 2019-07-20 ENCOUNTER — Ambulatory Visit: Payer: Medicare PPO | Admitting: Cardiovascular Disease

## 2019-07-20 VITALS — BP 124/80 | HR 64 | Ht 64.0 in | Wt 143.0 lb

## 2019-07-20 DIAGNOSIS — R011 Cardiac murmur, unspecified: Secondary | ICD-10-CM

## 2019-07-20 DIAGNOSIS — R0600 Dyspnea, unspecified: Secondary | ICD-10-CM

## 2019-07-20 DIAGNOSIS — R0609 Other forms of dyspnea: Secondary | ICD-10-CM

## 2019-07-20 NOTE — Progress Notes (Signed)
Chief Complaint  Patient presents with  . New Patient (Initial Visit)    dyspnea   History of Present Illness: 74 yo female with history of hypothyroidism and possible TIA who is here today as a new consult, referred by Dr. Waynard Edwards, for evaluation of dyspnea on exertion and new murmur. She tells me that she is very active. She exercises 3-5 days per week. She has no chest pain but she does notice more dyspnea on exertion over the past year. She has no LE edema or weight gain. No dizziness, near syncope or syncope. No palpitations.   Echo December 2018 with LVEF=55-60%, trivial AI, mild MR.   Primary Care Physician: Rodrigo Ran, MD   Past Medical History:  Diagnosis Date  . Abdominal pain 2004  . ADD (attention deficit disorder) 08/2010  . Allergic rhinitis 01/2011  . Asthma    unsure-,reflux ruled out using nasonex and pulmocort  . Atypical endocervical cells on Pap smear 1999  . 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 FHx: cancer   . HSV infection 1997  . Hypothyroidism   . Libido, decreased 2011  . Menopausal symptoms 2010  . Orgasmic dysfunction 2006  . Pelvic relaxation 12/2000  . PONV (postoperative nausea and vomiting)    scop patch has worked well in past  . Sinusitis 01/2011  . Stroke [facility])     Past Surgical History:  Procedure Laterality Date  . CARPAL TUNNEL RELEASE    . CATARACT EXTRACTION    . CHOLECYSTECTOMY  01/2011  . KNEE ARTHROSCOPY    . KNEE ARTHROSCOPY  03/05/2011   Procedure: ARTHROSCOPY KNEE;  Surgeon: Javier Docker;  Location: [facility];  Service: Orthopedics;  Laterality: Left;  left knee scope with debridement , Partial lateral meniscectomy  . ROTATOR CUFF REPAIR    . THUMB ARTHROSCOPY    . thumb surgery      Current Outpatient Medications  Medication Sig Dispense Refill  . acyclovir ointment (ZOVIRAX) 5 % Apply 1 application topically as needed.    Marland Kitchen aspirin EC 81 MG tablet  Take 1 tablet (81 mg total) by mouth daily. 1 tablet 0  . Cyanocobalamin (HM VITAMIN B-12 ULTRA STRENGTH) 5000 MCG TBDP Take 5 mLs by mouth.    . Evolocumab (REPATHA) 140 MG/ML SOSY Inject into the skin.    . fluticasone (FLONASE) 50 MCG/ACT nasal spray Place into both nostrils as needed for allergies or rhinitis.    Marland Kitchen levothyroxine (SYNTHROID) 50 MCG tablet Take 50 mcg by mouth daily before breakfast.    . Multiple Vitamins-Calcium (ONE-A-DAY WOMENS PO) Take by mouth.    . OMEGA-3 FATTY ACIDS PO Take by mouth.     . Ubiquinol 100 MG CAPS Take by mouth.    Marland Kitchen UNABLE TO FIND Tumeric takes as needed    . Zinc Chelated 22.5 MG TABS Take by mouth.     No current facility-administered medications for this visit.    Allergies  Allergen Reactions  . Clarithromycin     rash  . Penicillins     Social History   Socioeconomic History  . Marital status: Divorced    Spouse name: Not on file  . Number of children: 3  . Years of education: Not on file  . Highest education level: Not on file  Occupational History  . Occupation: Retired Runner, broadcasting/film/video, Active massage therapy  Tobacco Use  . Smoking status: Former Engineer, civil (consulting)  date: 02/28/1969    Years since quitting: 50.4  . Smokeless tobacco: Never Used  Substance and Sexual Activity  . Alcohol use: Yes    Comment: social vodka  . Drug use: No  . Sexual activity: Never  Other Topics Concern  . Not on file  Social History Narrative  . Not on file   Social Determinants of Health   Financial Resource Strain:   . Difficulty of Paying Living Expenses:   Food Insecurity:   . Worried About Programme researcher, broadcasting/film/video in the Last Year:   . Barista in the Last Year:   Transportation Needs:   . Freight forwarder (Medical):   Marland Kitchen Lack of Transportation (Non-Medical):   Physical Activity:   . Days of Exercise per Week:   . Minutes of Exercise per Session:   Stress:   . Feeling of Stress :   Social Connections:   . Frequency of  Communication with Friends and Family:   . Frequency of Social Gatherings with Friends and Family:   . Attends Religious Services:   . Active Member of Clubs or Organizations:   . Attends Banker Meetings:   Marland Kitchen Marital Status:   Intimate Partner Violence:   . Fear of Current or Ex-Partner:   . Emotionally Abused:   Marland Kitchen Physically Abused:   . Sexually Abused:     Family History  Problem Relation Age of Onset  . Hearing loss Brother   . Chronic Renal Failure Father     Review of Systems:  As stated in the HPI and otherwise negative.   BP 124/80   Pulse 64   Ht 5\' 4"  (1.626 m)   Wt 143 lb (64.9 kg)   SpO2 98%   BMI 24.55 kg/m   Physical Examination: General: Well developed, well nourished, NAD  HEENT: OP clear, mucus membranes moist  SKIN: warm, dry. No rashes. Neuro: No focal deficits  Musculoskeletal: Muscle strength 5/5 all ext  Psychiatric: Mood and affect normal  Neck: No JVD, no carotid bruits, no thyromegaly, no lymphadenopathy.  Lungs:Clear bilaterally, no wheezes, rhonci, crackles Cardiovascular: Regular rate and rhythm. Soft systolic murmur.  Abdomen:Soft. Bowel sounds present. Non-tender.  Extremities: No lower extremity edema. Pulses are 2 + in the bilateral DP/PT.  EKG:  EKG is ordered today. The ekg ordered today demonstrates sinus, T wave flattening anterolateral leads  Echo December 2018:  - Left ventricle: The cavity size was normal. Wall thickness was  normal. Systolic function was normal. The estimated ejection  fraction was in the range of 55% to 60%. Wall motion was normal;  there were no regional wall motion abnormalities. Doppler  parameters are consistent with abnormal left ventricular  relaxation (grade 1 diastolic dysfunction). The E/e&' ratio is <8,  suggesting normal LV filling pressure.  - Aortic valve: Structurally normal valve. Trileaflet. There was  trivial regurgitation.  - Aorta: Ascending aortic diameter:  39 mm (S).  - Ascending aorta: The ascending aorta was mildly dilated.  - Mitral valve: Mildly thickened leaflets . There was mild  regurgitation.  - Tricuspid valve: There was trivial regurgitation.  - Pulmonary arteries: PA peak pressure: 21 mm Hg (S).  - Inferior vena cava: The vessel was normal in size. The  respirophasic diameter changes were in the normal range (>= 50%),  consistent with normal central venous pressure.   Recent Labs: No results found for requested labs within last 8760 hours.   Lipid Panel  Component Value Date/Time   CHOL 197 07/22/2017 0911   TRIG 106 07/22/2017 0911   HDL 55 07/22/2017 0911   CHOLHDL 3.6 07/22/2017 0911   CHOLHDL 3.2 07/30/2010 0421   VLDL 12 07/30/2010 0421   LDLCALC 121 (H) 07/22/2017 0911     Wt Readings from Last 3 Encounters:  07/20/19 143 lb (64.9 kg)  07/22/17 141 lb 8 oz (64.2 kg)  07/07/17 136 lb (61.7 kg)      Assessment and Plan:   1. Dyspnea on exertion 2. Cardiac murmur  Will arrange an echo and exercise stress test. Her dyspnea is likely not cardiac related but given age, will arrange the stress test to exclude ischemia. Cardiac murmur to be assessed with echo. Mild MR by echo in 2018.   Current medicines are reviewed at length with the patient today.  The patient does not have concerns regarding medicines.  The following changes have been made:  no change  Labs/ tests ordered today include:   Orders Placed This Encounter  Procedures  . EXERCISE TOLERANCE TEST (ETT)  . EKG 12-Lead  . ECHOCARDIOGRAM COMPLETE     Disposition:   FU with me one year   Signed, Lauree Chandler, MD 07/20/2019 9:27 AM    [facility] [address], [place], [place]  [address] Phone: [phone]; Fax: [phone]

## 2019-07-20 NOTE — Patient Instructions (Signed)
Medication Instructions:  No changes *If you need a refill on your cardiac medications before your next appointment, please call your pharmacy*   Lab Work: none  Testing/Procedures: Your physician has requested that you have an echocardiogram. Echocardiography is a painless test that uses sound waves to create images of your heart. It provides your doctor with information about the size and shape of your heart and how well your heart's chambers and valves are working. This procedure takes approximately one hour. There are no restrictions for this procedure.  Your physician has requested that you have an exercise tolerance test. For further information please visit [email or web address] Please also follow instruction sheet, as given.   Follow-Up: At [facility], you and your health needs are our priority.  As part of our continuing mission to provide you with exceptional heart care, we have created designated Provider Care Teams.  These Care Teams include your primary Cardiologist (physician) and Advanced Practice Providers (APPs -  Physician Assistants and Nurse Practitioners) who all work together to provide you with the care you need, when you need it.  Your next appointment:   12 month(s)  The format for your next appointment:   In Person  Provider:   You may see Verne Carrow, MD or one of the following Advanced Practice Providers on your designated Care Team:    Ronie Spies, PA-C  Jacolyn Reedy, PA-C    Other Instructions

## 2019-08-03 DIAGNOSIS — Z09 Encounter for follow-up examination after completed treatment for conditions other than malignant neoplasm: Secondary | ICD-10-CM | POA: Insufficient documentation

## 2019-08-03 DIAGNOSIS — H35371 Puckering of macula, right eye: Secondary | ICD-10-CM | POA: Insufficient documentation

## 2019-08-03 DIAGNOSIS — H26491 Other secondary cataract, right eye: Secondary | ICD-10-CM | POA: Insufficient documentation

## 2019-08-03 DIAGNOSIS — Z961 Presence of intraocular lens: Secondary | ICD-10-CM | POA: Insufficient documentation

## 2019-08-03 DIAGNOSIS — H35372 Puckering of macula, left eye: Secondary | ICD-10-CM | POA: Insufficient documentation

## 2019-08-04 ENCOUNTER — Encounter (INDEPENDENT_AMBULATORY_CARE_PROVIDER_SITE_OTHER): Payer: Self-pay | Admitting: Ophthalmology

## 2019-08-04 ENCOUNTER — Ambulatory Visit (INDEPENDENT_AMBULATORY_CARE_PROVIDER_SITE_OTHER): Payer: Medicare PPO | Admitting: Ophthalmology

## 2019-08-04 ENCOUNTER — Other Ambulatory Visit: Payer: Self-pay

## 2019-08-04 DIAGNOSIS — Z961 Presence of intraocular lens: Secondary | ICD-10-CM

## 2019-08-04 DIAGNOSIS — H35371 Puckering of macula, right eye: Secondary | ICD-10-CM | POA: Diagnosis not present

## 2019-08-04 DIAGNOSIS — H35351 Cystoid macular degeneration, right eye: Secondary | ICD-10-CM | POA: Diagnosis not present

## 2019-08-04 DIAGNOSIS — H35372 Puckering of macula, left eye: Secondary | ICD-10-CM | POA: Diagnosis not present

## 2019-08-04 DIAGNOSIS — H26491 Other secondary cataract, right eye: Secondary | ICD-10-CM

## 2019-08-04 DIAGNOSIS — Z09 Encounter for follow-up examination after completed treatment for conditions other than malignant neoplasm: Secondary | ICD-10-CM

## 2019-08-04 DIAGNOSIS — H34231 Retinal artery branch occlusion, right eye: Secondary | ICD-10-CM | POA: Diagnosis not present

## 2019-08-04 MED ORDER — FLUORESCEIN SODIUM 10 % IV SOLN
500.0000 mg | INTRAVENOUS | Status: AC | PRN
  Administered 2019-08-04: 500 mg via INTRAVENOUS

## 2019-08-04 NOTE — Progress Notes (Signed)
08/04/2019     CHIEF COMPLAINT Patient presents for Retina Follow Up   HISTORY OF PRESENT ILLNESS: Erica Chase is a 74 y.o. female who presents to the clinic today for:   HPI    Retina Follow Up    Patient presents with  Other.  In right eye.  Severity is mild.  Since onset it is gradually worsening.          Comments    Patient referred from [facility] for blurriness and mild thickening on OCT OD. Patient states that she has blurriness in OD. Patient states that she cannot see road signs with her right eye.  **Patient states that the letters on the eye chart look like there are things growing off of them.        Last edited by Gerda Diss on 08/04/2019  3:31 PM. (History)      Referring physician: Crist Infante, MD [place],  [place] [address]  HISTORICAL INFORMATION:   Selected notes from the MEDICAL RECORD NUMBER    Lab Results  Component Value Date   HGBA1C 5.4 05/16/2017     CURRENT MEDICATIONS: No current outpatient medications on file. (Ophthalmic Drugs)   No current facility-administered medications for this visit. (Ophthalmic Drugs)   Current Outpatient Medications (Other)  Medication Sig  . acyclovir ointment (ZOVIRAX) 5 % Apply 1 application topically as needed.  Marland Kitchen aspirin EC 81 MG tablet Take 1 tablet (81 mg total) by mouth daily.  . Cyanocobalamin (HM VITAMIN B-12 ULTRA STRENGTH) 5000 MCG TBDP Take 5 mLs by mouth.  . Evolocumab (REPATHA) 140 MG/ML SOSY Inject into the skin.  . fluticasone (FLONASE) 50 MCG/ACT nasal spray Place into both nostrils as needed for allergies or rhinitis.  Marland Kitchen levothyroxine (SYNTHROID) 50 MCG tablet Take 50 mcg by mouth daily before breakfast.  . Multiple Vitamins-Calcium (ONE-A-DAY WOMENS PO) Take by mouth.  . OMEGA-3 FATTY ACIDS PO Take by mouth.   . Ubiquinol 100 MG CAPS Take by mouth.  Marland Kitchen UNABLE TO FIND Tumeric takes as needed  . Zinc Chelated 22.5 MG TABS Take by mouth.   No current  facility-administered medications for this visit. (Other)      REVIEW OF SYSTEMS:    ALLERGIES Allergies  Allergen Reactions  . Clarithromycin     rash  . Penicillins     PAST MEDICAL HISTORY Past Medical History:  Diagnosis Date  . Abdominal pain 2004  . ADD (attention deficit disorder) 08/2010  . Allergic rhinitis 01/2011  . Asthma    unsure-,reflux ruled out using nasonex and pulmocort  . Atypical endocervical cells on Pap smear 1999  . 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 FHx: cancer   . HSV infection 1997  . Hypothyroidism   . Libido, decreased 2011  . Menopausal symptoms 2010  . Orgasmic dysfunction 2006  . Pelvic relaxation 12/2000  . PONV (postoperative nausea and vomiting)    scop patch has worked well in past  . Sinusitis 01/2011  . Stroke [facility])    Past Surgical History:  Procedure Laterality Date  . CARPAL TUNNEL RELEASE    . CATARACT EXTRACTION    . CHOLECYSTECTOMY  01/2011  . KNEE ARTHROSCOPY    . KNEE ARTHROSCOPY  03/05/2011   Procedure: ARTHROSCOPY KNEE;  Surgeon: Johnn Hai;  Location: [place];  Service: Orthopedics;  Laterality: Left;  left knee scope with debridement , Partial lateral meniscectomy  .  ROTATOR CUFF REPAIR    . THUMB ARTHROSCOPY    . thumb surgery      FAMILY HISTORY Family History  Problem Relation Age of Onset  . Hearing loss Brother   . Chronic Renal Failure Father     SOCIAL HISTORY Social History   Tobacco Use  . Smoking status: Former Smoker    Quit date: 02/28/1969    Years since quitting: 50.4  . Smokeless tobacco: Never Used  Substance Use Topics  . Alcohol use: Yes    Comment: social vodka  . Drug use: No         OPHTHALMIC EXAM:  Base Eye Exam    Visual Acuity (Snellen - Linear)      Right Left   Dist cc 20/40 20/30-2   Dist ph cc NI NI   Correction: Glasses       Tonometry (Tonopen, 3:35 PM)      Right Left   Pressure  12 12       Pupils      Pupils Dark Light Shape React APD   Right PERRL 4 3 Round Slow None   Left PERRL 4 3 Round Slow None       Visual Fields (Counting fingers)      Left Right    Full Full       Extraocular Movement      Right Left    Full Full       Neuro/Psych    Oriented x3: Yes   Mood/Affect: Normal       Dilation    Both eyes: 1.0% Mydriacyl, 2.5% Phenylephrine @ 3:36 PM        Slit Lamp and Fundus Exam    External Exam      Right Left   External Normal Normal       Slit Lamp Exam      Right Left   Lids/Lashes Normal Normal   Conjunctiva/Sclera White and quiet White and quiet   Cornea Clear Clear   Anterior Chamber Deep and quiet Deep and quiet   Iris Round and reactive Round and reactive   Lens Posterior chamber intraocular lens, Open posterior capsule Posterior chamber intraocular lens   Anterior Vitreous Normal Normal       Fundus Exam      Right Left   Posterior Vitreous Vitrectomized Posterior vitreous detachment   Disc Normal Normal   C/D Ratio 0.15 0.1   Macula  Epiretinal membrane, minor   Vessels Normal Normal   Periphery Normal Normal          IMAGING AND PROCEDURES  Imaging and Procedures for 08/04/19  OCT, Retina - OU - Both Eyes       Right Eye Quality was good. Scan locations included subfoveal. Central Foveal Thickness: 404. Progression has been stable. Findings include abnormal foveal contour.   Left Eye Quality was good. Scan locations included subfoveal. Central Foveal Thickness: 341. Progression has been stable.   Notes OD, still with improved macular thickening albeit slow, post vitrectomy ILM peel.  No overt cystoid macular edema is noted however there are tiny microchanges nasal to the foveal avascular zone.  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  Fluorescein angiography will be recommended  OS with minor epiretinal membrane with no severe topographic changes and no overt cystoid macular edema         Color Fundus Photography Optos - OU - Both Eyes       Right Eye Progression  has improved. Disc findings include normal observations. Macula : normal observations. Vessels : normal observations. Periphery : normal observations.   Left Eye Progression has been stable. Disc findings include normal observations. Macula : epiretinal membrane. Vessels : normal observations. Periphery : normal observations.        Fluorescein Angiography Heidelberg (Transit OD)       Injection:  500 mg Fluorescein Sodium 10 % injection   NDC: [phone]   Route: Intravenous, Site: Left ArmRight Eye Early phase findings include normal observations, leakage, microaneurysm. Mid/Late phase findings include leakage.   Left Eye   Progression has no prior data. Early phase findings include normal observations. Mid/Late phase findings include normal observations. Choroidal neovascularization is not present.   Notes On fluorescein angiography, there is a small microaneurysm nasal to the foveal avascular zone and a small twig branch retinal vessel that has some very mild perivascular leakage.  There is also some deep outer capillary network blush temporal to the fovea although no overt angulated vessels and no overt macular telangiectasis and no remodeling of the capillary network.  This could be a variant of normal finding in the macula could also be a post ILM removal change to the vasculature.  I do have some suspicion for macular telangiectasis although this would be very early case.  There is no overt distal blockage however there is calcific and/or cholesterol deposition seen in the inferotemporal artery at the fourth or fifth bifurcation seen best on color picture            Her review of systems for sleep apnea is borderline at best.    ASSESSMENT/PLAN:  No problem-specific Assessment & Plan notes found for this encounter.      ICD-10-CM   1. 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 Posterior capsular opacification non visually significant of right eye  H26.491   3. Right epiretinal membrane  H35.371 Color Fundus Photography Optos - OU - Both Eyes    Fluorescein Angiography Heidelberg (Transit OD)    Fluorescein Sodium 10 % injection 500 mg  4. Pseudophakia of both eyes  Z96.1   5. Left epiretinal membrane  H35.372 OCT, Retina - OU - Both Eyes  6. Cystoid macular edema of right eye  H35.351 OCT, Retina - OU - Both Eyes    Discussion:  On fluorescein angiography, there is a small microaneurysm nasal to the foveal avascular zone and a small twig branch retinal vessel that has some very mild perivascular leakage.  There is also some deep outer capillary network blush temporal to the fovea although no overt angulated vessels and no overt macular telangiectasis and no remodeling of the capillary network.  This could be a variant of normal finding in the macula could also be a post ILM removal change to the vasculature.  I do have some suspicion for macular telangiectasis although this would be very early case. 1.  I have suggested patient to download the screening application snore lab and run the program to determine if her "purring" is in fact a possible mild case of sleep apnea.  2.  We will asked the patient to return for follow-up visit in 5 to 6 weeks for reassessment with OCT.  Fluorescein angiography with Heidelberg angiography may be undertaken if micro-CME changes over this time interval.  3.  We discussed at length possible vitamin deficiencies.  She is using vitamin B complex already and has an adequate diet with a yellow vegetables  for vitamin A.  I reviewed at length the fluorescein angiography findings and the subtleties thereof.  It is not quite clear that this is macular telangiectasis.  Furthermore there is no other overt retinal pathology nor vasculopathy   4.  OD, more careful examination upon the patient's departure I discussed disclosed  the finding in the distal inferotemporal retinal artery of cholesterol plaque deposition.  Her symptomatology may few be from a microembolus, fluorescein angiography however confirms there is no overt blockage  5.  Patient will need work-up and evaluation for carotid vascular disease and cardiac disease looking for cholesterol or calcific source of embolic plaque   6.  Our office will call Dr. Waynard Edwards tomorrow to reach out to the patient to arrange for carotid Dopplers, and cardiac evaluation as well as investigation of lipid levels  Ophthalmic Meds Ordered this visit:  Meds ordered this encounter  Medications  . Fluorescein Sodium 10 % injection 500 mg       Return in about 5 weeks (around 09/08/2019) for OD, OCT, dilate.  Patient Instructions  Patient left the office prior to my final a second review of the color fundus photography.  Upon review discloses a cholesterol yellowish plaque in the in the fifth bifurcation of the inferotemporal artery of the right eye.  She may have had micro emboli causing this visual disturbance and the microvascular changes seen and described on other angiographic interpretations in this note    Explained the diagnoses, plan, and follow up with the patient and they expressed understanding.  Patient expressed understanding of the importance of proper follow up care.   Alford Highland Caedyn Raygoza M.D. Diseases & Surgery of the Retina and Vitreous [facility] 08/04/19     Abbreviations: M myopia (nearsighted); A astigmatism; H hyperopia (farsighted); P presbyopia; Mrx spectacle prescription;  CTL contact lenses; OD right eye; OS left eye; OU both eyes  XT exotropia; ET esotropia; PEK punctate epithelial keratitis; PEE punctate epithelial erosions; DES dry eye syndrome; MGD meibomian gland dysfunction; ATs artificial tears; PFAT's preservative free artificial tears; NSC nuclear sclerotic cataract; PSC posterior subcapsular cataract; ERM epi-retinal  membrane; PVD posterior vitreous detachment; RD retinal detachment; DM diabetes mellitus; DR diabetic retinopathy; NPDR non-proliferative diabetic retinopathy; PDR proliferative diabetic retinopathy; CSME clinically significant macular edema; DME diabetic macular edema; dbh dot blot hemorrhages; CWS cotton wool spot; POAG primary open angle glaucoma; C/D cup-to-disc ratio; HVF humphrey visual field; GVF goldmann visual field; OCT optical coherence tomography; IOP intraocular pressure; BRVO Branch retinal vein occlusion; CRVO central retinal vein occlusion; CRAO central retinal artery occlusion; BRAO branch retinal artery occlusion; RT retinal tear; SB scleral buckle; PPV pars plana vitrectomy; VH Vitreous hemorrhage; PRP panretinal laser photocoagulation; IVK intravitreal kenalog; VMT vitreomacular traction; MH Macular hole;  NVD neovascularization of the disc; NVE neovascularization elsewhere; AREDS age related eye disease study; ARMD age related macular degeneration; POAG primary open angle glaucoma; EBMD epithelial/anterior basement membrane dystrophy; ACIOL anterior chamber intraocular lens; IOL intraocular lens; PCIOL posterior chamber intraocular lens; Phaco/IOL phacoemulsification with intraocular lens placement; PRK photorefractive keratectomy; LASIK laser assisted in situ keratomileusis; HTN hypertension; DM diabetes mellitus; COPD chronic obstructive pulmonary disease

## 2019-08-04 NOTE — Patient Instructions (Signed)
Patient left the office prior to my final a second review of the color fundus photography.  Upon review discloses a cholesterol yellowish plaque in the in the fifth bifurcation of the inferotemporal artery of the right eye.  She may have had micro emboli causing this visual disturbance and the microvascular changes seen and described on other angiographic interpretations in this note

## 2019-08-06 ENCOUNTER — Encounter (INDEPENDENT_AMBULATORY_CARE_PROVIDER_SITE_OTHER): Payer: Medicare PPO | Admitting: Ophthalmology

## 2019-08-06 ENCOUNTER — Encounter (INDEPENDENT_AMBULATORY_CARE_PROVIDER_SITE_OTHER): Payer: Self-pay | Admitting: Ophthalmology

## 2019-08-25 ENCOUNTER — Encounter (INDEPENDENT_AMBULATORY_CARE_PROVIDER_SITE_OTHER): Payer: Medicare PPO | Admitting: Ophthalmology

## 2019-08-28 ENCOUNTER — Other Ambulatory Visit (HOSPITAL_COMMUNITY)
Admission: RE | Admit: 2019-08-28 | Discharge: 2019-08-28 | Disposition: A | Discharge status: Home / Self Care | Payer: Medicare PPO | Source: Ambulatory Visit | Attending: Cardiovascular Disease | Admitting: Cardiovascular Disease

## 2019-08-28 DIAGNOSIS — Z20822 Contact with and (suspected) exposure to covid-19: Secondary | ICD-10-CM | POA: Insufficient documentation

## 2019-08-28 DIAGNOSIS — Z01812 Encounter for preprocedural laboratory examination: Secondary | ICD-10-CM | POA: Diagnosis not present

## 2019-08-28 LAB — SARS CORONAVIRUS 2 (TAT 6-24 HRS): SARS Coronavirus 2: NEGATIVE

## 2019-09-01 ENCOUNTER — Ambulatory Visit (INDEPENDENT_AMBULATORY_CARE_PROVIDER_SITE_OTHER): Payer: Medicare PPO

## 2019-09-01 ENCOUNTER — Other Ambulatory Visit: Payer: Self-pay

## 2019-09-01 ENCOUNTER — Ambulatory Visit (HOSPITAL_COMMUNITY): Payer: Medicare PPO | Attending: Cardiology

## 2019-09-01 DIAGNOSIS — R0609 Other forms of dyspnea: Secondary | ICD-10-CM

## 2019-09-01 DIAGNOSIS — R0600 Dyspnea, unspecified: Secondary | ICD-10-CM

## 2019-09-01 DIAGNOSIS — R011 Cardiac murmur, unspecified: Secondary | ICD-10-CM | POA: Diagnosis not present

## 2019-09-01 LAB — EXERCISE TOLERANCE TEST
Estimated workload: 10.1 METS
Exercise duration (min): 7 min
Exercise duration (sec): 56 s
MPHR: 147 {beats}/min
Peak HR: 120 {beats}/min
Percent HR: 81 %
RPE: 18
Rest HR: 50 {beats}/min

## 2019-09-02 NOTE — Telephone Encounter (Signed)
I spoke with patient and reviewed stress test results with her.  Appointment scheduled with Dr Clifton James for noon on June 7,2021

## 2019-09-07 ENCOUNTER — Ambulatory Visit: Payer: Medicare PPO | Admitting: Cardiovascular Disease

## 2019-09-07 ENCOUNTER — Other Ambulatory Visit: Payer: Self-pay

## 2019-09-07 ENCOUNTER — Encounter: Payer: Self-pay | Admitting: Cardiovascular Disease

## 2019-09-07 ENCOUNTER — Encounter (INDEPENDENT_AMBULATORY_CARE_PROVIDER_SITE_OTHER): Payer: Self-pay | Admitting: Ophthalmology

## 2019-09-07 ENCOUNTER — Ambulatory Visit (INDEPENDENT_AMBULATORY_CARE_PROVIDER_SITE_OTHER): Payer: Medicare PPO | Admitting: Ophthalmology

## 2019-09-07 VITALS — BP 146/86 | HR 68 | Ht 64.0 in | Wt 143.0 lb

## 2019-09-07 DIAGNOSIS — R9439 Abnormal result of other cardiovascular function study: Secondary | ICD-10-CM | POA: Diagnosis not present

## 2019-09-07 DIAGNOSIS — Z01812 Encounter for preprocedural laboratory examination: Secondary | ICD-10-CM | POA: Diagnosis not present

## 2019-09-07 DIAGNOSIS — I351 Nonrheumatic aortic (valve) insufficiency: Secondary | ICD-10-CM

## 2019-09-07 DIAGNOSIS — H34231 Retinal artery branch occlusion, right eye: Secondary | ICD-10-CM | POA: Diagnosis not present

## 2019-09-07 DIAGNOSIS — H35372 Puckering of macula, left eye: Secondary | ICD-10-CM | POA: Diagnosis not present

## 2019-09-07 DIAGNOSIS — R0609 Other forms of dyspnea: Secondary | ICD-10-CM

## 2019-09-07 DIAGNOSIS — R0600 Dyspnea, unspecified: Secondary | ICD-10-CM | POA: Diagnosis not present

## 2019-09-07 LAB — BASIC METABOLIC PANEL
BUN/Creatinine Ratio: 27 (ref 12–28)
BUN: 20 mg/dL (ref 8–27)
CO2: 26 mmol/L (ref 20–29)
Calcium: 9.5 mg/dL (ref 8.7–10.3)
Chloride: 103 mmol/L (ref 96–106)
Creatinine, Ser: 0.73 mg/dL (ref 0.57–1.00)
GFR calc Af Amer: 94 mL/min/{1.73_m2} (ref >59)
GFR calc non Af Amer: 82 mL/min/{1.73_m2} (ref >59)
Glucose: 81 mg/dL (ref 65–99)
Potassium: 4.1 mmol/L (ref 3.5–5.2)
Sodium: 140 mmol/L (ref 134–144)

## 2019-09-07 MED ORDER — METOPROLOL TARTRATE 100 MG PO TABS: ORAL_TABLET | ORAL | 0 refills | Status: DC

## 2019-09-07 NOTE — Progress Notes (Signed)
Chief Complaint  Patient presents with  . Follow-up    dyspnea   History of Present Illness: 74 yo female with history of hypothyroidism and possible TIA who is here today for follow up. I saw her as a new consult in April 2021 for the evaluation of dyspnea on exertion and new murmur. She tells me that she is very active. She exercises 3-5 days per week. She has no chest pain but she does notice more dyspnea on exertion over the past year. She has no LE edema or weight gain. No dizziness, near syncope or syncope. No palpitations. Echo December 2018 with LVEF=55-60%, trivial AI, mild MR. Echo 09/01/19 with LVEF=60-65%, moderate AI. Exercise stress test 09/01/19 with ischemic EKG changes.   She is here today for follow up. The patient denies any chest pain, palpitations, lower extremity edema, orthopnea, PND, dizziness, near syncope or syncope. She continues to have dyspnea with exertion.   Primary Care Physician: Rodrigo Ran, MD  Past Medical History:  Diagnosis Date  . Abdominal pain 2004  . ADD (attention deficit disorder) 08/2010  . Allergic rhinitis 01/2011  . Asthma    unsure-,reflux ruled out using nasonex and pulmocort  . Atypical endocervical cells on Pap smear 1999  . 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 FHx: cancer   . HSV infection 1997  . Hypothyroidism   . Libido, decreased 2011  . Menopausal symptoms 2010  . Orgasmic dysfunction 2006  . Pelvic relaxation 12/2000  . PONV (postoperative nausea and vomiting)    scop patch has worked well in past  . Sinusitis 01/2011  . Stroke [facility])     Past Surgical History:  Procedure Laterality Date  . CARPAL TUNNEL RELEASE    . CATARACT EXTRACTION    . CHOLECYSTECTOMY  01/2011  . KNEE ARTHROSCOPY    . KNEE ARTHROSCOPY  03/05/2011   Procedure: ARTHROSCOPY KNEE;  Surgeon: Javier Docker;  Location: [facility];  Service: Orthopedics;  Laterality: Left;  left knee scope  with debridement , Partial lateral meniscectomy  . ROTATOR CUFF REPAIR    . THUMB ARTHROSCOPY    . thumb surgery      Current Outpatient Medications  Medication Sig Dispense Refill  . acyclovir ointment (ZOVIRAX) 5 % Apply 1 application topically as needed.    Marland Kitchen aspirin EC 81 MG tablet Take 1 tablet (81 mg total) by mouth daily. 1 tablet 0  . Cyanocobalamin (HM VITAMIN B-12 ULTRA STRENGTH) 5000 MCG TBDP Take 5 mLs by mouth.    . Evolocumab (REPATHA) 140 MG/ML SOSY Inject into the skin.    . fluticasone (FLONASE) 50 MCG/ACT nasal spray Place into both nostrils as needed for allergies or rhinitis.    Marland Kitchen levothyroxine (SYNTHROID) 50 MCG tablet Take 50 mcg by mouth daily before breakfast.    . Multiple Vitamins-Calcium (ONE-A-DAY WOMENS PO) Take by mouth.    . OMEGA-3 FATTY ACIDS PO Take by mouth.     . Ubiquinol 100 MG CAPS Take by mouth.    Marland Kitchen UNABLE TO FIND Tumeric takes as needed    . Zinc Chelated 22.5 MG TABS Take by mouth.    . metoprolol tartrate (LOPRESSOR) 100 MG tablet Take one tablet by mouth 2 hours prior to ct 1 tablet 0   No current facility-administered medications for this visit.    Allergies  Allergen Reactions  . Clarithromycin     rash  . Penicillins  Social History   Socioeconomic History  . Marital status: Divorced    Spouse name: Not on file  . Number of children: 3  . Years of education: Not on file  . Highest education level: Not on file  Occupational History  . Occupation: Retired Runner, broadcasting/film/video, Active massage therapy  Tobacco Use  . Smoking status: Former Smoker    Quit date: 02/28/1969    Years since quitting: 50.5  . Smokeless tobacco: Never Used  Substance and Sexual Activity  . Alcohol use: Yes    Comment: social vodka  . Drug use: No  . Sexual activity: Never  Other Topics Concern  . Not on file  Social History Narrative  . Not on file   Social Determinants of Health   Financial Resource Strain:   . Difficulty of Paying Living  Expenses:   Food Insecurity:   . Worried About Programme researcher, broadcasting/film/video in the Last Year:   . Barista in the Last Year:   Transportation Needs:   . Freight forwarder (Medical):   Marland Kitchen Lack of Transportation (Non-Medical):   Physical Activity:   . Days of Exercise per Week:   . Minutes of Exercise per Session:   Stress:   . Feeling of Stress :   Social Connections:   . Frequency of Communication with Friends and Family:   . Frequency of Social Gatherings with Friends and Family:   . Attends Religious Services:   . Active Member of Clubs or Organizations:   . Attends Banker Meetings:   Marland Kitchen Marital Status:   Intimate Partner Violence:   . Fear of Current or Ex-Partner:   . Emotionally Abused:   Marland Kitchen Physically Abused:   . Sexually Abused:     Family History  Problem Relation Age of Onset  . Hearing loss Brother   . Chronic Renal Failure Father     Review of Systems:  As stated in the HPI and otherwise negative.   BP (!) 146/86   Pulse 68   Ht 5\' 4"  (1.626 m)   Wt 143 lb (64.9 kg)   SpO2 99%   BMI 24.55 kg/m   Physical Examination: General: Well developed, well nourished, NAD  HEENT: OP clear, mucus membranes moist  SKIN: warm, dry. No rashes. Neuro: No focal deficits  Musculoskeletal: Muscle strength 5/5 all ext  Psychiatric: Mood and affect normal  Neck: No JVD, no carotid bruits, no thyromegaly, no lymphadenopathy.  Lungs:Clear bilaterally, no wheezes, rhonci, crackles Cardiovascular: Regular rate and rhythm. Soft diastolic murmur.  Abdomen:Soft. Bowel sounds present. Non-tender.  Extremities: No lower extremity edema. Pulses are 2 + in the bilateral DP/PT.  EKG:  EKG is notordered today. The ekg ordered today demonstrates   Echo June 2021:  1. Left ventricular ejection fraction, by estimation, is 60 to 65%. The  left ventricle has normal function. The left ventricle has no regional  wall motion abnormalities. Left ventricular diastolic  parameters are  consistent with Grade I diastolic  dysfunction (impaired relaxation). The average left ventricular global  longitudinal strain is -24.3 %. The global longitudinal strain is normal.  2. Right ventricular systolic function is normal. The right ventricular  size is normal. There is normal pulmonary artery systolic pressure.  3. Left atrial size was mildly dilated.  4. The mitral valve is normal in structure. No evidence of mitral valve  regurgitation. No evidence of mitral stenosis.  5. The aortic valve is normal in structure. Aortic valve  regurgitation is  moderate. No aortic stenosis is present.  6. The inferior vena cava is normal in size with greater than 50%  respiratory variability, suggesting right atrial pressure of 3 mmHg.   Recent Labs: No results found for requested labs within last 8760 hours.   Lipid Panel    Component Value Date/Time   CHOL 197 07/22/2017 0911   TRIG 106 07/22/2017 0911   HDL 55 07/22/2017 0911   CHOLHDL 3.6 07/22/2017 0911   CHOLHDL 3.2 07/30/2010 0421   VLDL 12 07/30/2010 0421   LDLCALC 121 (H) 07/22/2017 0911     Wt Readings from Last 3 Encounters:  09/07/19 143 lb (64.9 kg)  07/20/19 143 lb (64.9 kg)  07/22/17 141 lb 8 oz (64.2 kg)      Assessment and Plan:   1.  Dyspnea on exertion: Abnormal exercise stress test. Will arrange a coronary CTA to exclude CAD.   2. Aortic valve insufficiency: Moderate by echo June 2021. Repeat echo one year.   Current medicines are reviewed at length with the patient today.  The patient does not have concerns regarding medicines.  The following changes have been made:  no change  Labs/ tests ordered today include:   Orders Placed This Encounter  Procedures  . CT CORONARY MORPH W/CTA COR W/SCORE W/CA W/CM &/OR WO/CM  . CT CORONARY FRACTIONAL FLOW RESERVE DATA PREP  . CT CORONARY FRACTIONAL FLOW RESERVE FLUID ANALYSIS  . Basic metabolic panel     Disposition:   FU with me six  weeks.    Signed, Lauree Chandler, MD 09/07/2019 1:45 PM    [facility] [address], [place], [place]  [address] Phone: [phone]; Fax: [phone]

## 2019-09-07 NOTE — Assessment & Plan Note (Signed)
Minor epiretinal membrane OS no change

## 2019-09-07 NOTE — Progress Notes (Signed)
09/07/2019     CHIEF COMPLAINT Patient presents for Retina Follow Up   HISTORY OF PRESENT ILLNESS: Erica Chase is a 74 y.o. female who presents to the clinic today for:   HPI    Retina Follow Up    Patient presents with  Other.  In right eye.  This started 5 weeks ago.  Severity is mild.  Duration of 5 weeks.  Since onset it is stable.          Comments    5 Week BRAO F/U OD  Pt reports stable fuzzy VA OD. Pt reports floaters that come and go OS x 3 days. Pt sts floaters OS were gone for a while and are back again x 3 days.       Last edited by Rockie Neighbours, Lewis on 09/07/2019  3:37 PM. (History)      Referring physician: Crist Infante, MD [place],  [place] [address]  HISTORICAL INFORMATION:   Selected notes from the MEDICAL RECORD NUMBER    Lab Results  Component Value Date   HGBA1C 5.4 05/16/2017     CURRENT MEDICATIONS: No current outpatient medications on file. (Ophthalmic Drugs)   No current facility-administered medications for this visit. (Ophthalmic Drugs)   Current Outpatient Medications (Other)  Medication Sig  . acyclovir ointment (ZOVIRAX) 5 % Apply 1 application topically as needed.  Marland Kitchen aspirin EC 81 MG tablet Take 1 tablet (81 mg total) by mouth daily.  . Cyanocobalamin (HM VITAMIN B-12 ULTRA STRENGTH) 5000 MCG TBDP Take 5 mLs by mouth.  . Evolocumab (REPATHA) 140 MG/ML SOSY Inject into the skin.  . fluticasone (FLONASE) 50 MCG/ACT nasal spray Place into both nostrils as needed for allergies or rhinitis.  Marland Kitchen levothyroxine (SYNTHROID) 50 MCG tablet Take 50 mcg by mouth daily before breakfast.  . metoprolol tartrate (LOPRESSOR) 100 MG tablet Take one tablet by mouth 2 hours prior to ct  . Multiple Vitamins-Calcium (ONE-A-DAY WOMENS PO) Take by mouth.  . OMEGA-3 FATTY ACIDS PO Take by mouth.   . Ubiquinol 100 MG CAPS Take by mouth.  Marland Kitchen UNABLE TO FIND Tumeric takes as needed  . Zinc Chelated 22.5 MG TABS Take by mouth.   No current  facility-administered medications for this visit. (Other)      REVIEW OF SYSTEMS:    ALLERGIES Allergies  Allergen Reactions  . Clarithromycin     rash  . Penicillins     PAST MEDICAL HISTORY Past Medical History:  Diagnosis Date  . Abdominal pain 2004  . ADD (attention deficit disorder) 08/2010  . Allergic rhinitis 01/2011  . Asthma    unsure-,reflux ruled out using nasonex and pulmocort  . Atypical endocervical cells on Pap smear 1999  . 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 FHx: cancer   . HSV infection 1997  . Hypothyroidism   . Libido, decreased 2011  . Menopausal symptoms 2010  . Orgasmic dysfunction 2006  . Pelvic relaxation 12/2000  . PONV (postoperative nausea and vomiting)    scop patch has worked well in past  . Sinusitis 01/2011  . Stroke [facility])    Past Surgical History:  Procedure Laterality Date  . CARPAL TUNNEL RELEASE    . CATARACT EXTRACTION    . CHOLECYSTECTOMY  01/2011  . KNEE ARTHROSCOPY    . KNEE ARTHROSCOPY  03/05/2011   Procedure: ARTHROSCOPY KNEE;  Surgeon: Johnn Hai;  Location: [facility];  Service:  Orthopedics;  Laterality: Left;  left knee scope with debridement , Partial lateral meniscectomy  . ROTATOR CUFF REPAIR    . THUMB ARTHROSCOPY    . thumb surgery      FAMILY HISTORY Family History  Problem Relation Age of Onset  . Hearing loss Brother   . Chronic Renal Failure Father     SOCIAL HISTORY Social History   Tobacco Use  . Smoking status: Former Smoker    Quit date: 02/28/1969    Years since quitting: 50.5  . Smokeless tobacco: Never Used  Substance Use Topics  . Alcohol use: Yes    Comment: social vodka  . Drug use: No         OPHTHALMIC EXAM:  Base Eye Exam    Visual Acuity (ETDRS)      Right Left   Dist cc 20/30 +2 20/30 +2   Dist ph cc 20/25 20/25   Correction: Glasses       Tonometry (Tonopen, 3:41 PM)      Right Left   Pressure  10 10       Pupils      Pupils Dark Light Shape React APD   Right PERRL 4 3 Round Brisk None   Left PERRL 4 3 Round Brisk None       Visual Fields (Counting fingers)      Left Right    Full Full       Extraocular Movement      Right Left    Full Full       Neuro/Psych    Oriented x3: Yes   Mood/Affect: Normal       Dilation    Both eyes: 1.0% Mydriacyl, 2.5% Phenylephrine @ 3:41 PM        Slit Lamp and Fundus Exam    External Exam      Right Left   External Normal Normal       Slit Lamp Exam      Right Left   Lids/Lashes Normal Normal   Conjunctiva/Sclera White and quiet White and quiet   Cornea Clear Clear   Anterior Chamber Deep and quiet Deep and quiet   Iris Round and reactive Round and reactive   Lens Posterior chamber intraocular lens, Open posterior capsule Posterior chamber intraocular lens   Anterior Vitreous Normal Normal       Fundus Exam      Right Left   Posterior Vitreous Vitrectomized Posterior vitreous detachment   Disc Normal Normal   C/D Ratio 0.15 0.1   Macula Normal Epiretinal membrane, minor   Vessels Normal Normal   Periphery Normal Normal          IMAGING AND PROCEDURES  Imaging and Procedures for 78/58/85  Basic metabolic panel     Component Value Flag Ref Range Units Status   Glucose 81      65 - 99 mg/dL Final   BUN 20      8 - 27 mg/dL Final   Creatinine, Ser 0.73      0.57 - 1.00 mg/dL Final   GFR calc non Af Amer 82      >59 mL/min/1.73 Final   GFR calc Af Amer 94      >59 mL/min/1.73 Final   Comment:   **[facility] currently reports eGFR in compliance with the current**   recommendations of the [employer]. [facility] will   update reporting as new guidelines are published from the NKF-ASN   Task force.  BUN/Creatinine Ratio 27      12 - 28  Final   Sodium 140      134 - 144 mmol/L Final   Potassium 4.1      3.5 - 5.2 mmol/L Final   Chloride 103      96 - 106 mmol/L Final   CO2 26      20 - 29  mmol/L Final   Calcium 9.5      8.7 - 10.3 mg/dL Final        OCT, Retina - OU - Both Eyes       Right Eye Quality was good. Scan locations included subfoveal. Central Foveal Thickness: 400. Progression has improved.   Left Eye Quality was good. Scan locations included subfoveal. Progression has been stable.   Notes Some 6 months status post vitrectomy membrane peel OD.  Slow improvement in retinal thickening.  No residual foveal ILM.                ASSESSMENT/PLAN:  Left epiretinal membrane Minor epiretinal membrane OS no change      ICD-10-CM   1. 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 Left epiretinal membrane  H35.372     1.  2.  3.  Ophthalmic Meds Ordered this visit:  No orders of the defined types were placed in this encounter.      Return in about 4 months (around 01/07/2020) for DILATE OU, OCT.  There are no Patient Instructions on file for this visit.   Explained the diagnoses, plan, and follow up with the patient and they expressed understanding.  Patient expressed understanding of the importance of proper follow up care.   Clent Demark Reilley Valentine M.D. Diseases & Surgery of the Retina and Vitreous [facility] [place] 09/07/19     Abbreviations: M myopia (nearsighted); A astigmatism; H hyperopia (farsighted); P presbyopia; Mrx spectacle prescription;  CTL contact lenses; OD right eye; OS left eye; OU both eyes  XT exotropia; ET esotropia; PEK punctate epithelial keratitis; PEE punctate epithelial erosions; DES dry eye syndrome; MGD meibomian gland dysfunction; ATs artificial tears; PFAT's preservative free artificial tears; [place] nuclear sclerotic cataract; PSC posterior subcapsular cataract; ERM epi-retinal membrane; PVD posterior vitreous detachment; RD retinal detachment; DM diabetes mellitus; DR diabetic retinopathy; NPDR non-proliferative diabetic retinopathy; PDR proliferative diabetic  retinopathy; CSME clinically significant macular edema; DME diabetic macular edema; dbh dot blot hemorrhages; CWS cotton wool spot; POAG primary open angle glaucoma; C/D cup-to-disc ratio; HVF humphrey visual field; GVF goldmann visual field; OCT optical coherence tomography; IOP intraocular pressure; BRVO Branch retinal vein occlusion; CRVO central retinal vein occlusion; CRAO central retinal artery occlusion; BRAO branch retinal artery occlusion; RT retinal tear; SB scleral buckle; PPV pars plana vitrectomy; VH Vitreous hemorrhage; PRP panretinal laser photocoagulation; IVK intravitreal kenalog; VMT vitreomacular traction; MH Macular hole;  NVD neovascularization of the disc; NVE neovascularization elsewhere; AREDS age related eye disease study; ARMD age related macular degeneration; POAG primary open angle glaucoma; EBMD epithelial/anterior basement membrane dystrophy; ACIOL anterior chamber intraocular lens; IOL intraocular lens; PCIOL posterior chamber intraocular lens; Phaco/IOL phacoemulsification with intraocular lens placement; Benjamin photorefractive keratectomy; LASIK laser assisted in situ keratomileusis; HTN hypertension; DM diabetes mellitus; COPD chronic obstructive pulmonary disease

## 2019-09-07 NOTE — Patient Instructions (Addendum)
Medication Instructions:  No changes *If you need a refill on your cardiac medications before your next appointment, please call your pharmacy*   Lab Work: Today: BMET If you have labs (blood work) drawn today and your tests are completely normal, you will receive your results only by: Marland Kitchen MyChart Message (if you have MyChart) OR . A paper copy in the mail If you have any lab test that is abnormal or we need to change your treatment, we will call you to review the results.   Testing/Procedures: Your physician has requested that you have cardiac CT. Cardiac computed tomography (CT) is a painless test that uses an x-ray machine to take clear, detailed pictures of your heart. For further information please visit [facility]uge[facility]iest[facility]tn. Please follow instruction sheet as given.  Follow-Up: At [facility], you and your health needs are our priority.  As part of our continuing mission to provide you with exceptional heart care, we have created designated Provider Care Teams.  These Care Teams include your primary Cardiologist (physician) and Advanced Practice Providers (APPs -  Physician Assistants and Nurse Practitioners) who all work together to provide you with the care you need, when you need it.  Your next appointment:   6 week(s)  The format for your next appointment:   In Person  Provider:   You may see Lauree Chandler, MD or one of the following Advanced Practice Providers on your designated Care Team:    Melina Copa, PA-C  Ermalinda Barrios, PA-C    Other Instructions  Your cardiac CT will be scheduled at :   [facility] [address] [place],  [address] [phone]   Please arrive at the [facility] main entrance of [facility] 30 minutes prior to test start time. Proceed to the [facility] Radiology Department (first floor) to check-in and test prep.  Please follow these instructions carefully (unless otherwise directed):  On the Night  Before the Test: . Be sure to Drink plenty of water. . Do not consume any caffeinated/decaffeinated beverages or chocolate 12 hours prior to your test. . Do not take any antihistamines 12 hours prior to your test.  On the Day of the Test: . Drink plenty of water. Do not drink any water within one hour of the test. . Do not eat any food 4 hours prior to the test. . You may take your regular medications prior to the test. . Take metoprolol tartrate (Lopressor) 100 mg 2 hours prior to the test. . FEMALES- please wear underwire-free bra if available       After the Test: . Drink plenty of water. . After receiving IV contrast, you may experience a mild flushed feeling. This is normal. . On occasion, you may experience a mild rash up to 24 hours after the test. This is not dangerous. If this occurs, you can take Benadryl 25 mg and increase your fluid intake. . If you experience trouble breathing, this can be serious. If it is severe call 911 IMMEDIATELY. If it is mild, please call our office. . If you take any of these medications: Glipizide/Metformin, Avandament, Glucavance, please do not take 48 hours after completing test unless otherwise instructed.   Once we have confirmed authorization from your insurance company, we will call you to set up a date and time for your test.   For non-scheduling related questions, please contact the cardiac imaging nurse navigator should you have any questions/concerns: Marchia Bond, Cardiac Imaging Nurse Navigator Burley Saver,  Interim Cardiac Imaging Nurse [place] [facility] Direct Office Dial: [phone]   For scheduling needs, including cancellations and rescheduling, please call [phone].

## 2019-09-08 DIAGNOSIS — Z961 Presence of intraocular lens: Secondary | ICD-10-CM | POA: Diagnosis not present

## 2019-09-09 DIAGNOSIS — I351 Nonrheumatic aortic (valve) insufficiency: Secondary | ICD-10-CM

## 2019-09-10 NOTE — Addendum Note (Signed)
Addended by: Lendon Ka on: 09/10/2019 05:16 PM   Modules accepted: Orders

## 2019-09-10 NOTE — Telephone Encounter (Signed)
Thanks Erica Chase, I changed the order to stat. She had an abnormal exercise tolerance test and we added her in clinic to be seen urgently to discuss plan of care.

## 2019-09-10 NOTE — Telephone Encounter (Signed)
Erica Chase   If this needs to be done quickly I would change the order to urgent or stat and that will make it a priority for authorization and then I can get her scheduled.  Thanks  Beacher May

## 2019-09-11 ENCOUNTER — Telehealth (HOSPITAL_COMMUNITY): Payer: Self-pay | Admitting: *Deleted

## 2019-09-11 NOTE — Telephone Encounter (Signed)

## 2019-09-11 NOTE — Telephone Encounter (Signed)
I called and left a message for patient to call back to schedule cardiac ct.   Hopefully we get a call back today and I can get her scheduled for next week.   Thanks  Carney Bern

## 2019-09-14 ENCOUNTER — Encounter (HOSPITAL_COMMUNITY): Payer: Self-pay

## 2019-09-14 ENCOUNTER — Ambulatory Visit (HOSPITAL_COMMUNITY)
Admission: RE | Admit: 2019-09-14 | Discharge: 2019-09-14 | Disposition: A | Discharge status: Home / Self Care | Payer: Medicare PPO | Source: Ambulatory Visit | Attending: Cardiovascular Disease | Admitting: Cardiovascular Disease

## 2019-09-14 ENCOUNTER — Other Ambulatory Visit: Payer: Self-pay

## 2019-09-14 DIAGNOSIS — I700 Atherosclerosis of aorta: Secondary | ICD-10-CM | POA: Diagnosis not present

## 2019-09-14 DIAGNOSIS — R0600 Dyspnea, unspecified: Secondary | ICD-10-CM | POA: Insufficient documentation

## 2019-09-14 DIAGNOSIS — R0609 Other forms of dyspnea: Secondary | ICD-10-CM

## 2019-09-14 DIAGNOSIS — R9430 Abnormal result of cardiovascular function study, unspecified: Secondary | ICD-10-CM | POA: Diagnosis not present

## 2019-09-14 MED ORDER — IOHEXOL 350 MG/ML SOLN
80.0000 mL | Freq: Once | INTRAVENOUS | Status: AC | PRN
  Administered 2019-09-14: 80 mL via INTRAVENOUS

## 2019-09-14 MED ORDER — NITROGLYCERIN 0.4 MG SL SUBL
0.8000 mg | SUBLINGUAL_TABLET | Freq: Once | SUBLINGUAL | Status: AC
  Administered 2019-09-14: 0.8 mg via SUBLINGUAL

## 2019-09-14 MED ORDER — NITROGLYCERIN 0.4 MG SL SUBL
SUBLINGUAL_TABLET | SUBLINGUAL | Status: AC
  Filled 2019-09-14: qty 1

## 2019-09-17 NOTE — Telephone Encounter (Addendum)
Addressed question re: EKG.

## 2019-09-21 DIAGNOSIS — R32 Unspecified urinary incontinence: Secondary | ICD-10-CM | POA: Diagnosis not present

## 2019-09-21 DIAGNOSIS — Z1211 Encounter for screening for malignant neoplasm of colon: Secondary | ICD-10-CM | POA: Diagnosis not present

## 2019-09-21 DIAGNOSIS — L259 Unspecified contact dermatitis, unspecified cause: Secondary | ICD-10-CM | POA: Diagnosis not present

## 2019-09-21 DIAGNOSIS — Z01419 Encounter for gynecological examination (general) (routine) without abnormal findings: Secondary | ICD-10-CM | POA: Diagnosis not present

## 2019-09-21 DIAGNOSIS — Z1239 Encounter for other screening for malignant neoplasm of breast: Secondary | ICD-10-CM | POA: Diagnosis not present

## 2019-09-21 DIAGNOSIS — R350 Frequency of micturition: Secondary | ICD-10-CM | POA: Diagnosis not present

## 2019-09-21 DIAGNOSIS — Z1231 Encounter for screening mammogram for malignant neoplasm of breast: Secondary | ICD-10-CM | POA: Diagnosis not present

## 2019-09-21 DIAGNOSIS — Z6824 Body mass index (BMI) 24.0-24.9, adult: Secondary | ICD-10-CM | POA: Diagnosis not present

## 2019-09-21 DIAGNOSIS — N952 Postmenopausal atrophic vaginitis: Secondary | ICD-10-CM | POA: Diagnosis not present

## 2019-09-21 NOTE — Addendum Note (Signed)
Addended by: Lendon Ka on: 09/21/2019 03:53 PM   Modules accepted: Orders

## 2019-09-28 DIAGNOSIS — L905 Scar conditions and fibrosis of skin: Secondary | ICD-10-CM | POA: Diagnosis not present

## 2019-09-28 DIAGNOSIS — L821 Other seborrheic keratosis: Secondary | ICD-10-CM | POA: Diagnosis not present

## 2019-09-28 DIAGNOSIS — L853 Xerosis cutis: Secondary | ICD-10-CM | POA: Diagnosis not present

## 2019-09-28 DIAGNOSIS — D1801 Hemangioma of skin and subcutaneous tissue: Secondary | ICD-10-CM | POA: Diagnosis not present

## 2019-09-28 DIAGNOSIS — K130 Diseases of lips: Secondary | ICD-10-CM | POA: Diagnosis not present

## 2019-09-28 DIAGNOSIS — D225 Melanocytic nevi of trunk: Secondary | ICD-10-CM | POA: Diagnosis not present

## 2019-09-28 DIAGNOSIS — L814 Other melanin hyperpigmentation: Secondary | ICD-10-CM | POA: Diagnosis not present

## 2019-09-28 DIAGNOSIS — Z85828 Personal history of other malignant neoplasm of skin: Secondary | ICD-10-CM | POA: Diagnosis not present

## 2019-10-06 DIAGNOSIS — M62838 Other muscle spasm: Secondary | ICD-10-CM | POA: Diagnosis not present

## 2019-10-06 DIAGNOSIS — N3942 Incontinence without sensory awareness: Secondary | ICD-10-CM | POA: Diagnosis not present

## 2019-10-06 DIAGNOSIS — N393 Stress incontinence (female) (male): Secondary | ICD-10-CM | POA: Diagnosis not present

## 2019-10-06 DIAGNOSIS — M6281 Muscle weakness (generalized): Secondary | ICD-10-CM | POA: Diagnosis not present

## 2019-10-06 DIAGNOSIS — R351 Nocturia: Secondary | ICD-10-CM | POA: Diagnosis not present

## 2019-10-06 DIAGNOSIS — R350 Frequency of micturition: Secondary | ICD-10-CM | POA: Diagnosis not present

## 2019-10-06 DIAGNOSIS — M6289 Other specified disorders of muscle: Secondary | ICD-10-CM | POA: Diagnosis not present

## 2019-10-19 DIAGNOSIS — M25551 Pain in right hip: Secondary | ICD-10-CM | POA: Diagnosis not present

## 2019-10-19 DIAGNOSIS — M5417 Radiculopathy, lumbosacral region: Secondary | ICD-10-CM | POA: Diagnosis not present

## 2019-10-22 DIAGNOSIS — L259 Unspecified contact dermatitis, unspecified cause: Secondary | ICD-10-CM | POA: Diagnosis not present

## 2019-10-22 DIAGNOSIS — N952 Postmenopausal atrophic vaginitis: Secondary | ICD-10-CM | POA: Diagnosis not present

## 2019-10-22 DIAGNOSIS — M5416 Radiculopathy, lumbar region: Secondary | ICD-10-CM | POA: Diagnosis not present

## 2019-10-22 DIAGNOSIS — R350 Frequency of micturition: Secondary | ICD-10-CM | POA: Diagnosis not present

## 2019-10-22 DIAGNOSIS — Z13820 Encounter for screening for osteoporosis: Secondary | ICD-10-CM | POA: Diagnosis not present

## 2019-10-23 DIAGNOSIS — M6289 Other specified disorders of muscle: Secondary | ICD-10-CM | POA: Diagnosis not present

## 2019-10-23 DIAGNOSIS — M6281 Muscle weakness (generalized): Secondary | ICD-10-CM | POA: Diagnosis not present

## 2019-10-23 DIAGNOSIS — M62838 Other muscle spasm: Secondary | ICD-10-CM | POA: Diagnosis not present

## 2019-10-23 DIAGNOSIS — N393 Stress incontinence (female) (male): Secondary | ICD-10-CM | POA: Diagnosis not present

## 2019-10-26 ENCOUNTER — Ambulatory Visit: Payer: Medicare PPO | Admitting: Cardiovascular Disease

## 2019-10-29 ENCOUNTER — Encounter: Payer: Self-pay | Admitting: Neurology

## 2019-11-02 DIAGNOSIS — M25551 Pain in right hip: Secondary | ICD-10-CM | POA: Diagnosis not present

## 2019-11-04 ENCOUNTER — Encounter: Payer: Self-pay | Admitting: Neurology

## 2019-11-04 ENCOUNTER — Ambulatory Visit: Payer: Medicare PPO | Admitting: Neurology

## 2019-11-04 VITALS — BP 158/84 | HR 52 | Ht 63.5 in | Wt 141.0 lb

## 2019-11-04 DIAGNOSIS — R0683 Snoring: Secondary | ICD-10-CM | POA: Diagnosis not present

## 2019-11-04 DIAGNOSIS — Z961 Presence of intraocular lens: Secondary | ICD-10-CM | POA: Diagnosis not present

## 2019-11-04 DIAGNOSIS — N951 Menopausal and female climacteric states: Secondary | ICD-10-CM | POA: Diagnosis not present

## 2019-11-04 DIAGNOSIS — H35371 Puckering of macula, right eye: Secondary | ICD-10-CM | POA: Diagnosis not present

## 2019-11-04 DIAGNOSIS — H35372 Puckering of macula, left eye: Secondary | ICD-10-CM
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
## 2019-11-04 NOTE — Patient Instructions (Signed)

## 2019-11-05 DIAGNOSIS — M5416 Radiculopathy, lumbar region: Secondary | ICD-10-CM | POA: Diagnosis not present

## 2019-11-10 DIAGNOSIS — M5416 Radiculopathy, lumbar region: Secondary | ICD-10-CM | POA: Diagnosis not present

## 2019-11-11 ENCOUNTER — Encounter: Payer: Self-pay | Admitting: Neurology

## 2019-11-12 DIAGNOSIS — M5416 Radiculopathy, lumbar region: Secondary | ICD-10-CM | POA: Diagnosis not present

## 2019-11-16 DIAGNOSIS — M5416 Radiculopathy, lumbar region: Secondary | ICD-10-CM | POA: Diagnosis not present

## 2019-11-18 ENCOUNTER — Encounter (INDEPENDENT_AMBULATORY_CARE_PROVIDER_SITE_OTHER): Payer: Medicare PPO | Admitting: Ophthalmology

## 2019-11-18 DIAGNOSIS — M5416 Radiculopathy, lumbar region: Secondary | ICD-10-CM | POA: Diagnosis not present

## 2019-11-19 DIAGNOSIS — J018 Other acute sinusitis: Secondary | ICD-10-CM | POA: Diagnosis not present

## 2019-11-19 DIAGNOSIS — R05 Cough: Secondary | ICD-10-CM | POA: Diagnosis not present

## 2019-11-19 DIAGNOSIS — Z1152 Encounter for screening for COVID-19: Secondary | ICD-10-CM | POA: Diagnosis not present

## 2019-11-25 DIAGNOSIS — M5416 Radiculopathy, lumbar region: Secondary | ICD-10-CM | POA: Diagnosis not present

## 2019-11-27 DIAGNOSIS — M5416 Radiculopathy, lumbar region: Secondary | ICD-10-CM | POA: Diagnosis not present

## 2019-11-30 DIAGNOSIS — M5416 Radiculopathy, lumbar region: Secondary | ICD-10-CM | POA: Diagnosis not present

## 2019-12-04 DIAGNOSIS — E785 Hyperlipidemia, unspecified: Secondary | ICD-10-CM | POA: Diagnosis not present

## 2019-12-04 DIAGNOSIS — E039 Hypothyroidism, unspecified: Secondary | ICD-10-CM | POA: Diagnosis not present

## 2019-12-09 ENCOUNTER — Ambulatory Visit (INDEPENDENT_AMBULATORY_CARE_PROVIDER_SITE_OTHER): Payer: Medicare PPO | Admitting: Neurology

## 2019-12-09 DIAGNOSIS — G4733 Obstructive sleep apnea (adult) (pediatric): Secondary | ICD-10-CM

## 2019-12-09 DIAGNOSIS — G4734 Idiopathic sleep related nonobstructive alveolar hypoventilation: Secondary | ICD-10-CM

## 2019-12-09 DIAGNOSIS — G4730 Sleep apnea, unspecified: Secondary | ICD-10-CM

## 2019-12-09 DIAGNOSIS — H35371 Puckering of macula, right eye: Secondary | ICD-10-CM

## 2019-12-09 DIAGNOSIS — H35372 Puckering of macula, left eye: Secondary | ICD-10-CM

## 2019-12-09 DIAGNOSIS — R0683 Snoring: Secondary | ICD-10-CM

## 2019-12-11 DIAGNOSIS — I700 Atherosclerosis of aorta: Secondary | ICD-10-CM | POA: Diagnosis not present

## 2019-12-11 DIAGNOSIS — G459 Transient cerebral ischemic attack, unspecified: Secondary | ICD-10-CM | POA: Diagnosis not present

## 2019-12-11 DIAGNOSIS — E039 Hypothyroidism, unspecified: Secondary | ICD-10-CM | POA: Diagnosis not present

## 2019-12-11 DIAGNOSIS — Q790 Congenital diaphragmatic hernia: Secondary | ICD-10-CM | POA: Diagnosis not present

## 2019-12-11 DIAGNOSIS — B009 Herpesviral infection, unspecified: Secondary | ICD-10-CM | POA: Diagnosis not present

## 2019-12-11 DIAGNOSIS — Z1212 Encounter for screening for malignant neoplasm of rectum: Secondary | ICD-10-CM | POA: Diagnosis not present

## 2019-12-11 DIAGNOSIS — E785 Hyperlipidemia, unspecified: Secondary | ICD-10-CM | POA: Diagnosis not present

## 2019-12-11 DIAGNOSIS — Z0000 Encounter for general adult medical examination without abnormal findings: Secondary | ICD-10-CM | POA: Diagnosis not present

## 2019-12-11 DIAGNOSIS — M25571 Pain in right ankle and joints of right foot: Secondary | ICD-10-CM | POA: Diagnosis not present

## 2019-12-11 DIAGNOSIS — R82998 Other abnormal findings in urine: Secondary | ICD-10-CM | POA: Diagnosis not present

## 2019-12-11 DIAGNOSIS — I6389 Other cerebral infarction: Secondary | ICD-10-CM | POA: Diagnosis not present

## 2019-12-22 DIAGNOSIS — M5416 Radiculopathy, lumbar region: Secondary | ICD-10-CM | POA: Diagnosis not present

## 2019-12-22 DIAGNOSIS — M25551 Pain in right hip: Secondary | ICD-10-CM | POA: Diagnosis not present

## 2019-12-23 DIAGNOSIS — G4730 Sleep apnea, unspecified: Secondary | ICD-10-CM | POA: Insufficient documentation

## 2019-12-23 DIAGNOSIS — G4734 Idiopathic sleep related nonobstructive alveolar hypoventilation: Secondary | ICD-10-CM | POA: Insufficient documentation

## 2019-12-23 DIAGNOSIS — R0683 Snoring: Secondary | ICD-10-CM | POA: Insufficient documentation

## 2019-12-23 NOTE — Addendum Note (Signed)
Addended by: Melvyn Novas on: 12/23/2019 12:26 PM   Modules accepted: Orders

## 2019-12-23 NOTE — Procedures (Signed)
Sleep Study Report   Patient Information     First Name: Erica Chase. Last Name: Chase ID: [id number]  Birth Date: [birth year] Age: 74 Gender: Female  Referring Provider: Dr. Luciana Chase and Dr Erica Chase BMI: 24.1 (W=141 lb, H=5' 4'')  Neck Circ.:  14 '' Epworth:  0/24   Sleep Study Information    Study Date: 12/09/19 S/H/A Version: [id number] / 4.1.1528 / 79  History:    Erica Chase is a right-handed Caucasian female with a possible sleep disorder.  She has a medical history of Abdominal pain (2004), ADD (attention deficit disorder) (08/2010), Allergic rhinitis (01/2011), Asthma, Atypical endocervical cells (1999), CIN III (cervical intraepithelial neoplasia grade III) with severe dysplasia (2001), HSV infection (1997), Hyperlipidemia (2018), Hypothyroidism, Menopausal symptoms (2010), Sinusitis (01/2011), and Stroke (HCC). Erica Chase is also a history of hypothyroidism and has been treated for about 8 years with medication, she carries a diagnosis of adult ADD, she had 3 normal pregnancies and home birth. She has seasonal allergic rhinitis which may be dust related.  She had a left eye cataract surgery 2015 frozen shoulder treatment in May 2017- non-surgically.  In December 2018 she suffered small right and left-sided cerebellar infarcts these were new in comparison to an image obtained in 2015 but they do not acute been found on MRI.  She has a mildly dilated ascending aorta at 39 mm which is checked every 2 years.  Epiretinal retinal membrane formation is actually the reason for the evaluation of obstructive sleep apnea.      Summary & Diagnosis:    Surprisingly moderate -severity of OSA at AHI of 30.1/h and strong REM sleep dependency- REM AHI was 56.3/h-  this is an estimate by HST algorithm. The patient spent 135 minutes in hypoxemia- a clinically relevant degree.   Recommendations:     This patient will need urgent PAP therapy. for the constellation of findings: hypoxia, REM dependent  moderate-severe sleep apnea and bradycardia there is no other way of treatment. Dental device and inspire technology are not addressing this patient's need.  I would like for this patient to come to the sleep lab for a titration study which can also investigate the nee for oxygen supplementation.  PS : If her insurance will not allow this step, we will need to initiate CPAP autotitration first and follow up on the therapeutic data and order an ONO.   Interpreting Physician: Erica Novas, MD          Sleep Summary  Oxygen Saturation Statistics   Start Study Time: End Study Time: Total Recording Time:          10:48:42 PM 7:45:52 AM   8 h, 57 min  Total Sleep Time % REM of Sleep Time:  8 h, 7 min  31.3    Mean: 90 Minimum: 72 Maximum: 97  Mean of Desaturations Nadirs (%):   87  Oxygen Desaturation. %:  4-9 10-20 >20 Total  Events Number Total   118  19 86.1 13.9  0 0.0  137 100.0  Oxygen Saturation: <90 <=88 <85 <80 <70  Duration (minutes): Sleep % 135.5 27.8 69.3 10.3 14.2 2.1 1.5 0.3 0.0 0.0     Respiratory Indices      Total Events REM NREM All Night  pRDI: pAHI 3%: ODI 4%: pAHIc 3%: % CSR: pAHI 4%:  257  241  137  0 0.0 153 57.5 56.3 39.1 0.0 20.9 18.6 7.4 0.0 32.1 30.1 17.1 0.0 19.1  Pulse Rate Statistics during Sleep (BPM)      Mean: 54 Minimum: 41 Maximum: 87    Indices are calculated using technically valid sleep time of 8 h, 0 min. pRDI/pAHI are calculated using 02 desaturations ? 3%                                             pAHI=30.1                                                           Mild              Moderate                    Severe                                                 5              15                    30   Body Position Statistics  Position Supine Prone Right Left Non-Supine  Sleep (min) 303.2 50.5 52.5 81.0 184.0  Sleep % 62.2 10.4 10.8 16.6 37.8  pRDI 35.4 25.0 11.5 37.8 26.8  pAHI 3% 33.6  23.8 9.2 34.9 24.5  ODI 4% 21.7 7.1 3.4 15.6 9.8               Left   Prone  Right  Supine    Snoring Statistics Snoring Level (dB) >40 >50 >60 >70 >80 >Threshold (45)  Sleep (min) 264.4 24.1 2.4 0.3 0.0 75.1  Sleep % 54.3 4.9 0.5 0.1 0.0 15.4    Mean: 42 dB

## 2019-12-23 NOTE — Progress Notes (Signed)
Surprisingly, a  moderate -severity of OSA at AHI of 30.1/h and strong REM sleep dependency were found- the REM AHI was 56.3/h- this is an estimate by HST algorithm.  The patient spent 135 minutes in hypoxemia- a clinically relevant degree of hypoxia in context with moderate severe sleep apnea and bardycardia.   Recommendations:    This patient will need urgent PAP therapy. for the constellation of findings: hypoxia, REM dependent moderate-severe sleep apnea and bradycardia there is no other way of treatment. Dental device and inspire technology are not addressing this patient's need.  I would like for this patient to come to the sleep lab for a titration study which can also investigate the nee for oxygen supplementation.  PS : If her insurance will not allow this step, we will need to initiate CPAP autotitration first and follow up on the therapeutic data and order an ONO.   Interpreting Physician: Melvyn Novas, MD

## 2019-12-24 ENCOUNTER — Encounter: Payer: Self-pay | Admitting: Neurology

## 2019-12-24 ENCOUNTER — Telehealth: Payer: Self-pay | Admitting: Neurology

## 2019-12-24 NOTE — Telephone Encounter (Signed)
Called patient to discuss sleep study results. No answer at this time. LVM for the patient to call back.  Advised I would leave a mychart message and patient can reply to that. Advised phone system currently is down for incoming calls.

## 2019-12-24 NOTE — Telephone Encounter (Signed)
-----   Message from Melvyn Novas, MD sent at 12/23/2019 12:26 PM EDT ----- Surprisingly, a  moderate -severity of OSA at AHI of 30.1/h and strong REM sleep dependency were found- the REM AHI was 56.3/h- this is an estimate by HST algorithm.  The patient spent 135 minutes in hypoxemia- a clinically relevant degree of hypoxia in context with moderate severe sleep apnea and bardycardia.   Recommendations:    This patient will need urgent PAP therapy. for the constellation of findings: hypoxia, REM dependent moderate-severe sleep apnea and bradycardia there is no other way of treatment. Dental device and inspire technology are not addressing this patient's need.  I would like for this patient to come to the sleep lab for a titration study which can also investigate the nee for oxygen supplementation.  PS : If her insurance will not allow this step, we will need to initiate CPAP autotitration first and follow up on the therapeutic data and order an ONO.   Interpreting Physician: Melvyn Novas, MD

## 2019-12-24 NOTE — Telephone Encounter (Signed)
Called the patient back and reviewed with her the questions that she had through [place]. Advised that unfortunately due to the severity of the sleep apnea and they hypoxia that she had inspire and dental device would not be effective. To truly treat the sleep apnea effectively and low oxygen CPAP is the best option. She verbalized understanding and will wait for either call from sleep lab or me whichever is allowed

## 2019-12-28 DIAGNOSIS — M545 Low back pain: Secondary | ICD-10-CM | POA: Diagnosis not present

## 2019-12-28 DIAGNOSIS — M25551 Pain in right hip: Secondary | ICD-10-CM | POA: Diagnosis not present

## 2019-12-29 DIAGNOSIS — M25551 Pain in right hip: Secondary | ICD-10-CM | POA: Diagnosis not present

## 2019-12-29 DIAGNOSIS — M545 Low back pain: Secondary | ICD-10-CM | POA: Diagnosis not present

## 2020-01-04 ENCOUNTER — Other Ambulatory Visit: Payer: Self-pay

## 2020-01-04 ENCOUNTER — Ambulatory Visit (INDEPENDENT_AMBULATORY_CARE_PROVIDER_SITE_OTHER): Payer: Medicare PPO | Admitting: Neurology

## 2020-01-04 ENCOUNTER — Encounter: Payer: Self-pay | Admitting: Neurology

## 2020-01-04 VITALS — BP 138/67 | HR 52 | Ht 63.5 in | Wt 140.0 lb

## 2020-01-04 DIAGNOSIS — M545 Low back pain: Secondary | ICD-10-CM | POA: Diagnosis not present

## 2020-01-04 DIAGNOSIS — G4734 Idiopathic sleep related nonobstructive alveolar hypoventilation: Secondary | ICD-10-CM

## 2020-01-04 DIAGNOSIS — Z961 Presence of intraocular lens: Secondary | ICD-10-CM | POA: Diagnosis not present

## 2020-01-04 DIAGNOSIS — G4733 Obstructive sleep apnea (adult) (pediatric): Secondary | ICD-10-CM

## 2020-01-04 DIAGNOSIS — M542 Cervicalgia: Secondary | ICD-10-CM | POA: Diagnosis not present

## 2020-01-04 NOTE — Telephone Encounter (Signed)
I want to follow up on the CPAP therapy  you will start using in the meantime and we check another sleep baseline in 6-12 month to see effect of alternative therapies.  If the baseline AHI  hasn't changed, or not enough,  the CPAP will stay in place. CD

## 2020-01-04 NOTE — Patient Instructions (Signed)
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
## 2020-01-07 ENCOUNTER — Ambulatory Visit (INDEPENDENT_AMBULATORY_CARE_PROVIDER_SITE_OTHER): Payer: Medicare PPO | Admitting: Ophthalmology

## 2020-01-07 ENCOUNTER — Other Ambulatory Visit: Payer: Self-pay

## 2020-01-07 ENCOUNTER — Encounter (INDEPENDENT_AMBULATORY_CARE_PROVIDER_SITE_OTHER): Payer: Self-pay | Admitting: Ophthalmology

## 2020-01-07 DIAGNOSIS — H34231 Retinal artery branch occlusion, right eye: Secondary | ICD-10-CM

## 2020-01-07 DIAGNOSIS — H35371 Puckering of macula, right eye: Secondary | ICD-10-CM | POA: Diagnosis not present

## 2020-01-07 NOTE — Assessment & Plan Note (Signed)
Post vitrectomy, membrane peel right eye with continued slow improvement in foveal thickness.

## 2020-01-07 NOTE — Progress Notes (Signed)
01/07/2020     CHIEF COMPLAINT Patient presents for Retina Follow Up   HISTORY OF PRESENT ILLNESS: Erica Chase is a 74 y.o. female who presents to the clinic today for:   HPI    Retina Follow Up    Patient presents with  Other.  In right eye.  This started 4 months ago.  Severity is mild.  Duration of 4 months.  Since onset it is stable.          Comments    4 Month F/U OU  Pt sts she feels OS is getting blurry, especially noticeable when driving. Pt reports she notices difficulty seeing at night. Pt denies ocular pain or flashes OU. Pt sts floaters have decreased OU.       Last edited by Ileana Roup, COA on 01/07/2020  3:02 PM. (History)      Referring physician: Rodrigo Ran, MD [address] [place],  [place] [address]  HISTORICAL INFORMATION:   Selected notes from the MEDICAL RECORD NUMBER    Lab Results  Component Value Date   HGBA1C 5.4 05/16/2017     CURRENT MEDICATIONS: No current outpatient medications on file. (Ophthalmic Drugs)   No current facility-administered medications for this visit. (Ophthalmic Drugs)   Current Outpatient Medications (Other)  Medication Sig  . acyclovir ointment (ZOVIRAX) 5 % Apply 1 application topically as needed.  Marland Kitchen aspirin EC 81 MG tablet Take 1 tablet (81 mg total) by mouth daily.  . Cyanocobalamin (HM VITAMIN B-12 ULTRA STRENGTH) 5000 MCG TBDP Take 5 mLs by mouth.  . Evolocumab (REPATHA) 140 MG/ML SOSY Inject into the skin.  . fluticasone (FLONASE) 50 MCG/ACT nasal spray Place into both nostrils as needed for allergies or rhinitis.  Marland Kitchen levothyroxine (SYNTHROID) 50 MCG tablet Take 50 mcg by mouth daily before breakfast.  . Multiple Vitamins-Calcium (ONE-A-DAY WOMENS PO) Take by mouth.  . OMEGA-3 FATTY ACIDS PO Take by mouth.   . Ubiquinol 100 MG CAPS Take by mouth.  Marland Kitchen UNABLE TO FIND Tumeric takes as needed  . Zinc Chelated 22.5 MG TABS Take by mouth.   No current facility-administered medications for this  visit. (Other)      REVIEW OF SYSTEMS:    ALLERGIES Allergies  Allergen Reactions  . Clarithromycin     rash  . Penicillins     PAST MEDICAL HISTORY Past Medical History:  Diagnosis Date  . Abdominal pain 2004  . ADD (attention deficit disorder) 08/2010  . Allergic rhinitis 01/2011  . Asthma    unsure-,reflux ruled out using nasonex and pulmocort  . Atypical endocervical cells on Pap smear 1999  . 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 FHx: cancer   . HSV infection 1997  . Hyperlipidemia 2018  . Hypothyroidism   . Libido, decreased 2011  . Menopausal symptoms 2010  . Orgasmic dysfunction 2006  . Ovarian cyst   . Pelvic relaxation 12/2000  . PONV (postoperative nausea and vomiting)    scop patch has worked well in past  . Sinusitis 01/2011  . Stroke [facility])    Past Surgical History:  Procedure Laterality Date  . CARPAL TUNNEL RELEASE    . CATARACT EXTRACTION Left 2015  . CHOLECYSTECTOMY  01/2011  . KNEE ARTHROSCOPY    . KNEE ARTHROSCOPY  03/05/2011   Procedure: ARTHROSCOPY KNEE;  Surgeon: Javier Docker;  Location: [facility];  Service: Orthopedics;  Laterality: Left;  left knee scope with debridement ,  Partial lateral meniscectomy  . ROTATOR CUFF REPAIR    . THUMB ARTHROSCOPY    . thumb surgery      FAMILY HISTORY Family History  Problem Relation Age of Onset  . Hearing loss Brother   . Chronic Renal Failure Father   . Heart disease Father   . Arthritis Father     SOCIAL HISTORY Social History   Tobacco Use  . Smoking status: Former Smoker    Quit date: 02/28/1969    Years since quitting: 50.8  . Smokeless tobacco: Never Used  Substance Use Topics  . Alcohol use: Yes    Comment: social vodka  . Drug use: No         OPHTHALMIC EXAM: Base Eye Exam    Visual Acuity (ETDRS)      Right Left   Dist cc 20/20 -2 20/20 -1   Correction: Glasses       Tonometry (Tonopen, 3:02 PM)       Right Left   Pressure 11 09       Pupils      Pupils Dark Light Shape React APD   Right PERRL 4 3 Round Brisk None   Left PERRL 4 3 Round Brisk None       Visual Fields (Counting fingers)      Left Right    Full Full       Extraocular Movement      Right Left    Full Full       Neuro/Psych    Oriented x3: Yes   Mood/Affect: Normal       Dilation    Both eyes: 1.0% Mydriacyl, 2.5% Phenylephrine @ 3:07 PM        Slit Lamp and Fundus Exam    External Exam      Right Left   External Normal Normal       Slit Lamp Exam      Right Left   Lids/Lashes Normal Normal   Conjunctiva/Sclera White and quiet White and quiet   Cornea Clear Clear   Anterior Chamber Deep and quiet Deep and quiet   Iris Round and reactive Round and reactive   Lens Posterior chamber intraocular lens, Open posterior capsule Posterior chamber intraocular lens   Anterior Vitreous Normal Normal       Fundus Exam      Right Left   Posterior Vitreous Vitrectomized Posterior vitreous detachment   Disc Normal Normal   C/D Ratio 0.15 0.1   Macula Normal Epiretinal membrane, minor   Vessels Normal Normal   Periphery Normal Normal          IMAGING AND PROCEDURES  Imaging and Procedures for 01/07/20  OCT, Retina - OU - Both Eyes       Right Eye Quality was good. Central Foveal Thickness: 384. Findings include retinal drusen .   Left Eye Quality was good. Scan locations included subfoveal. Central Foveal Thickness: 329. Findings include retinal drusen , epiretinal membrane.   Notes Overall diffuse thickening in the macula secondary to prior epiretinal membrane OD, yet foveal thickness continues to slowly improve.  OS with nonfoveal involved epiretinal membrane, no visual impact thus we will observe.                ASSESSMENT/PLAN:  Right epiretinal membrane Post vitrectomy, membrane peel right eye with continued slow improvement in foveal thickness.      ICD-10-CM   1.  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 Right epiretinal membrane  H35.371     1.  Patient completing her assessment for sleep apnea recently Dr. Porfirio Mylar Dohmeier.  2.  I do recommend complete therapy for sleep apnea to maximize oxygenation throughout the night which will improve not only macular healing but most importantly preserve normal brain functioning.  It well improved typically folks since of wellbeing, energy levels and overall metabolism.  3.  Ophthalmic Meds Ordered this visit:  No orders of the defined types were placed in this encounter.      Return in about 1 year (around 01/06/2021) for DILATE OU, OCT.  There are no Patient Instructions on file for this visit.   Explained the diagnoses, plan, and follow up with the patient and they expressed understanding.  Patient expressed understanding of the importance of proper follow up care.   Alford Highland Viki Carrera M.D. Diseases & Surgery of the Retina and Vitreous [facility] 01/07/20     Abbreviations: M myopia (nearsighted); A astigmatism; H hyperopia (farsighted); P presbyopia; Mrx spectacle prescription;  CTL contact lenses; OD right eye; OS left eye; OU both eyes  XT exotropia; ET esotropia; PEK punctate epithelial keratitis; PEE punctate epithelial erosions; DES dry eye syndrome; MGD meibomian gland dysfunction; ATs artificial tears; PFAT's preservative free artificial tears; NSC nuclear sclerotic cataract; PSC posterior subcapsular cataract; ERM epi-retinal membrane; PVD posterior vitreous detachment; RD retinal detachment; DM diabetes mellitus; DR diabetic retinopathy; NPDR non-proliferative diabetic retinopathy; PDR proliferative diabetic retinopathy; CSME clinically significant macular edema; DME diabetic macular edema; dbh dot blot hemorrhages; CWS cotton wool spot; POAG primary open angle glaucoma; C/D cup-to-disc ratio; HVF humphrey visual field; GVF goldmann  visual field; OCT optical coherence tomography; IOP intraocular pressure; BRVO Branch retinal vein occlusion; CRVO central retinal vein occlusion; CRAO central retinal artery occlusion; BRAO branch retinal artery occlusion; RT retinal tear; SB scleral buckle; PPV pars plana vitrectomy; VH Vitreous hemorrhage; PRP panretinal laser photocoagulation; IVK intravitreal kenalog; VMT vitreomacular traction; MH Macular hole;  NVD neovascularization of the disc; NVE neovascularization elsewhere; AREDS age related eye disease study; ARMD age related macular degeneration; POAG primary open angle glaucoma; EBMD epithelial/anterior basement membrane dystrophy; ACIOL anterior chamber intraocular lens; IOL intraocular lens; PCIOL posterior chamber intraocular lens; Phaco/IOL phacoemulsification with intraocular lens placement; PRK photorefractive keratectomy; LASIK laser assisted in situ keratomileusis; HTN hypertension; DM diabetes mellitus; COPD chronic obstructive pulmonary disease

## 2020-01-11 DIAGNOSIS — M545 Low back pain: Secondary | ICD-10-CM | POA: Diagnosis not present

## 2020-01-11 DIAGNOSIS — M542 Cervicalgia: Secondary | ICD-10-CM | POA: Diagnosis not present

## 2020-01-20 ENCOUNTER — Other Ambulatory Visit: Payer: Self-pay

## 2020-01-20 ENCOUNTER — Ambulatory Visit (INDEPENDENT_AMBULATORY_CARE_PROVIDER_SITE_OTHER): Payer: Medicare PPO | Admitting: Neurology

## 2020-01-20 DIAGNOSIS — G4733 Obstructive sleep apnea (adult) (pediatric): Secondary | ICD-10-CM

## 2020-01-20 DIAGNOSIS — H35371 Puckering of macula, right eye: Secondary | ICD-10-CM

## 2020-01-20 DIAGNOSIS — H35372 Puckering of macula, left eye: Secondary | ICD-10-CM

## 2020-01-20 DIAGNOSIS — G4734 Idiopathic sleep related nonobstructive alveolar hypoventilation: Secondary | ICD-10-CM

## 2020-01-20 DIAGNOSIS — G4730 Sleep apnea, unspecified: Secondary | ICD-10-CM

## 2020-01-31 NOTE — Addendum Note (Signed)
Addended by: Melvyn Novas on: 01/31/2020 04:54 PM   Modules accepted: Orders

## 2020-01-31 NOTE — Procedures (Signed)
PATIENT'S NAME:  Erica, Chase DOB:      23-Aug-1945      MR#:    [id number]     DATE OF RECORDING: 01/20/2020  MR REFERRING M.D.:  Erica Ran MD Study Performed:   CPAP  Titration HISTORY:  Erica Chase is returning for a positive airway pressure therapy titration.  The patient's HST on 22 December 2019 revealed: Moderate severe OSA and an AHI of 30.1/h and strong REM sleep dependency- her REM AHI was 56.3/h, she spent 135 minutes in hypoxemia. ECG with PVCs.   She has a past medical history of Abdominal pain (2004), ADD (attention deficit disorder) (08/2010), Allergic rhinitis (01/2011), Asthma, Atypical endocervical cells on Pap smear (1999), CIN III (cervical intraepithelial neoplasia grade III) with severe dysplasia (2001), FHx: cancer, Hyperlipidemia (2018), Hypothyroidism, Menopausal libido symptoms (2010), Sinusitis (01/2011), and Stroke (HCC).   The patient endorsed the Epworth Sleepiness Scale at 0.   The patient's weight 141 pounds with a height of 64 (inches), resulting in a BMI of 24.7 kg/m2. The patient's neck circumference measured 14 inches.  CURRENT MEDICATIONS: Aspirin, Vitamin B12, Repatha, Flonase, Synthroid, One-A-Day Women's OTC vitamin, Omega 3, Ubiquinol, Turmeric, Zinc   PROCEDURE:  This is a multichannel digital polysomnogram utilizing the SomnoStar 11.2 system.  Electrodes and sensors were applied and monitored per AASM Specifications.   EEG, EOG, Chin and Limb EMG, were sampled at 200 Hz.  ECG, Snore and Nasal Pressure, Thermal Airflow, Respiratory Effort, CPAP Flow and Pressure, Oximetry was sampled at 50 Hz. Digital video and audio were recorded.      The patient was given a Small size" Simplus" FFM mask by Mr. Erica Chase. CPAP was initiated at 5 cmH20 with heated humidity per AASM split night standards and pressure was advanced to only 7 cmH20 because of hypopneas, apneas and desaturations.  At a PAP pressure of 7 cmH20, there was a reduction of the AHI to  0.0/h with improvement of sleep apnea.  Lights Out was at 22:09 and Lights On at 04:55. Total recording time (TRT) was 406.5 minutes, with a total sleep time (TST) of 246.5 minutes. The patient's sleep latency was 64.5 minutes. REM latency was 179 minutes.  The sleep efficiency was 60.6 %.    SLEEP ARCHITECTURE: WASO (Wake after sleep onset) was 107 minutes.  There were 8 minutes in Stage N1, 62 minutes Stage N2, 129 minutes Stage N3 and 47.5 minutes in Stage REM.  The percentage of Stage N1 was 3.2%, Stage N2 was 25.2%, Stage N3 was 52.3% and Stage R (REM sleep) was 19.3%. The sleep architecture was notable for delayed sleep onset and delayed REM sleep onset.  RESPIRATORY ANALYSIS:  There was a total of 4 respiratory events: 4 hypopneas with a hypopnea index of 1.0/hour. The total APNEA/HYPOPNEA INDEX (AHI) was 1.0 /hour. 3 events occurred in REM sleep and 1 events in NREM. The REM AHI was 3.8 /hour versus a non-REM AHI of 0.3 /hour. The patient spent 126 minutes of total sleep time in the supine position and 121 minutes in non-supine. The supine AHI was 0.5, versus a non-supine AHI of 1.5/h.  OXYGEN SATURATION & C02:  The baseline 02 saturation was 96%, with the lowest being 86%. Time spent below 89% saturation equaled now only 2 minutes. The arousals were noted as: 14 were spontaneous, 0 were associated with PLMs, 0 were associated with respiratory events. The patient had a total of 0 Periodic Limb Movements.   Audio and video analysis  did not show any abnormal or unusual movements, behaviors, phonations or vocalizations.  There were 2 nocturia related breaks.  No more Snoring was noted at 7 cm CPAP pressure and the nadir rose to 92% SpO2, the sleep efficiency to 95 %. EKG was in keeping with normal sinus rhythm (NSR) with many PVCs. I attached a screen shot print to the technical report.    DIAGNOSIS 1. Obstructive Sleep Apnea and hypoxia, snoring and sleep fragmentation improved under CPAP ,  and were best tolerated at 6 and 7 cm water pressure. 2. Sleep Related Hypoxemia resolved.  3. There were many PVCs during this titration study ECG noted.    PLANS/RECOMMENDATIONS: I hope that Erica Chase can use a FFM as given to her- the choice was made due to her reports of nasal airway obstruction/ congestion.  The CPAP machine would ordered at a set pressure window from 5-9 cm water with 1 cm EPR. Heated humidification will be provided.  Please call the patient and make sure she is inclined to use this therapy.   A follow up appointment will be scheduled with our NPs in the Sleep Clinic at [facility].   Please call [phone] with any questions.      I certify that I have reviewed the entire raw data recording prior to the issuance of this report in accordance with the Standards of Accreditation of the American Academy of Sleep Medicine (AASM)   Erica Chase, M.D. Diplomat, Biomedical engineer of Psychiatry and Neurology  Diplomat, Biomedical engineer of Sleep Medicine Wellsite geologist, [employer] [facility] [employer]

## 2020-01-31 NOTE — Progress Notes (Signed)
DIAGNOSIS 1. Obstructive Sleep Apnea and hypoxia, snoring and sleep fragmentation improved under CPAP, and were best tolerated at 6 and 7 cm water pressure. 2. Sleep Related Hypoxemia resolved.  3. There were many PVCs during this titration study's ECG noted.  4. An ONO on CPAP is not required.    PLANS/RECOMMENDATIONS: I hope that Ms. Becka can use a FFM as given to her- the choice was made due to her reports of nasal airway obstruction/ congestion. DME may fit her for an alternative interface.  The CPAP machine would be ordered at a set pressure window from 5-9 cm water with 1 cm EPR. Heated humidification will be provided.  Please call the patient and make sure she is inclined to use this therapy.  Please send a copy to Dr Fawn Kirk, MD ophthalmologist and retinal specialist.  A follow up appointment will be scheduled with our NPs in the Sleep Clinic at [facility].   Please call [phone] with any questions.

## 2020-02-01 ENCOUNTER — Encounter: Payer: Self-pay | Admitting: Neurology

## 2020-02-04 ENCOUNTER — Telehealth: Payer: Self-pay | Admitting: Neurology

## 2020-02-04 ENCOUNTER — Other Ambulatory Visit: Payer: Self-pay | Admitting: Neurology

## 2020-02-04 DIAGNOSIS — G4733 Obstructive sleep apnea (adult) (pediatric): Secondary | ICD-10-CM | POA: Diagnosis not present

## 2020-02-04 NOTE — Telephone Encounter (Signed)
Pt also sent a mychart message. I have discussed further over messaging. Order placed for a travel cpap.

## 2020-02-04 NOTE — Telephone Encounter (Signed)
Error

## 2020-02-04 NOTE — Telephone Encounter (Signed)
Pt is asking for a call from Casey,RN re: her wanting a CPAP for traveling

## 2020-02-08 ENCOUNTER — Telehealth: Payer: Self-pay | Admitting: Neurology

## 2020-02-08 NOTE — Telephone Encounter (Signed)
Pt called wanting to speak to the RN regarding the my chart message that was sent to her. Pt has several questions about the information sent. Please advise.

## 2020-02-09 ENCOUNTER — Other Ambulatory Visit: Payer: Self-pay | Admitting: Neurology

## 2020-02-09 MED ORDER — TRAZODONE HCL 50 MG PO TABS: 25.0000 mg | ORAL_TABLET | Freq: Every day | ORAL | 0 refills | Status: DC

## 2020-02-09 NOTE — Telephone Encounter (Signed)
I replied to her on mychart

## 2020-02-16 ENCOUNTER — Other Ambulatory Visit: Payer: Self-pay | Admitting: Neurology

## 2020-02-16 DIAGNOSIS — Z789 Other specified health status: Secondary | ICD-10-CM

## 2020-03-02 ENCOUNTER — Other Ambulatory Visit: Payer: Self-pay | Admitting: Neurology

## 2020-03-08 ENCOUNTER — Ambulatory Visit: Payer: Medicare PPO | Admitting: Adult Health

## 2020-03-14 ENCOUNTER — Telehealth: Payer: Self-pay | Admitting: Neurology

## 2020-03-14 NOTE — Telephone Encounter (Signed)
Dear Erica Chase,  I am so sorry to not be able to help Erica Chase with an oxygen- only order.   Here is why; My dilemma is that I cannot prescribe oxygen for a patient without titration to CPAP.  Those are the limitations of our sleep lab imposed by insurance, most strict rules by MEDICARE.  We already know that Erica Chase had low oxygen saturation during the night, and the sleep study documented this.  As there was only mild apnea and if a patient is unable   tolerating the PAP, we cannot do the oxygen therapy in this setting, as that is what Medicare allows only in conjunction with PAP.  Fazit: if the low oxygen is corrected on PAP, the treatment I have to prescribe is PAP therapy.  If the PAP therapy did alleviate apnea but not hypoxia, then I am allowed to bring the patient back on PAP and titrate her to oxygen in the sleep lab.   This means we have to involve a cardiologist or pulmonologist for the oxygen order.  We used to be able to do this until about 4 years ago- but not any longer.   Erica Chase.

## 2020-03-15 ENCOUNTER — Encounter: Payer: Self-pay | Admitting: Neurology

## 2020-03-15 ENCOUNTER — Telehealth: Payer: Self-pay

## 2020-03-15 NOTE — Telephone Encounter (Signed)
Dr. Vickey Huger would like to bring patient back in for [facility] talk. She spoke with Thayer Ohm and she is a good candidate for [employer]

## 2020-03-21 ENCOUNTER — Other Ambulatory Visit (HOSPITAL_COMMUNITY): Payer: Self-pay | Admitting: Internal Medicine

## 2020-03-21 DIAGNOSIS — M7989 Other specified soft tissue disorders: Secondary | ICD-10-CM

## 2020-03-22 ENCOUNTER — Ambulatory Visit (HOSPITAL_COMMUNITY)
Admission: RE | Admit: 2020-03-22 | Discharge: 2020-03-22 | Disposition: A | Discharge status: Home / Self Care | Payer: Medicare PPO | Source: Ambulatory Visit | Attending: Internal Medicine | Admitting: Internal Medicine

## 2020-03-22 ENCOUNTER — Other Ambulatory Visit: Payer: Self-pay

## 2020-03-22 DIAGNOSIS — M7989 Other specified soft tissue disorders: Secondary | ICD-10-CM | POA: Diagnosis not present

## 2020-03-22 DIAGNOSIS — M79662 Pain in left lower leg: Secondary | ICD-10-CM | POA: Insufficient documentation

## 2020-03-22 NOTE — Progress Notes (Signed)
Lower extremity venous has been completed.   Preliminary results in CV Proc.   Blanch Media 03/22/2020 9:12 AM

## 2020-03-27 DIAGNOSIS — Z20822 Contact with and (suspected) exposure to covid-19: Secondary | ICD-10-CM | POA: Diagnosis not present

## 2020-03-27 DIAGNOSIS — R110 Nausea: Secondary | ICD-10-CM | POA: Diagnosis not present

## 2020-03-27 DIAGNOSIS — M791 Myalgia, unspecified site: Secondary | ICD-10-CM | POA: Diagnosis not present

## 2020-03-27 DIAGNOSIS — Z9189 Other specified personal risk factors, not elsewhere classified: Secondary | ICD-10-CM | POA: Diagnosis not present

## 2020-03-27 DIAGNOSIS — J3489 Other specified disorders of nose and nasal sinuses: Secondary | ICD-10-CM | POA: Diagnosis not present

## 2020-03-27 DIAGNOSIS — R5383 Other fatigue: Secondary | ICD-10-CM | POA: Diagnosis not present

## 2020-03-27 DIAGNOSIS — R0981 Nasal congestion: Secondary | ICD-10-CM | POA: Diagnosis not present

## 2020-03-27 DIAGNOSIS — H9209 Otalgia, unspecified ear: Secondary | ICD-10-CM | POA: Diagnosis not present

## 2020-03-27 DIAGNOSIS — Z87891 Personal history of nicotine dependence: Secondary | ICD-10-CM | POA: Diagnosis not present

## 2020-03-27 DIAGNOSIS — J168 Pneumonia due to other specified infectious organisms: Secondary | ICD-10-CM | POA: Diagnosis not present

## 2020-03-27 DIAGNOSIS — R0602 Shortness of breath: Secondary | ICD-10-CM | POA: Diagnosis not present

## 2020-03-27 DIAGNOSIS — R519 Headache, unspecified: Secondary | ICD-10-CM | POA: Diagnosis not present

## 2020-03-27 DIAGNOSIS — R059 Cough, unspecified: Secondary | ICD-10-CM | POA: Diagnosis not present

## 2020-04-05 ENCOUNTER — Ambulatory Visit: Payer: Medicare PPO | Admitting: Adult Health

## 2020-04-07 ENCOUNTER — Ambulatory Visit: Payer: Self-pay | Admitting: Neurology

## 2020-04-07 DIAGNOSIS — G4734 Idiopathic sleep related nonobstructive alveolar hypoventilation: Secondary | ICD-10-CM | POA: Diagnosis not present

## 2020-05-08 DIAGNOSIS — G4734 Idiopathic sleep related nonobstructive alveolar hypoventilation: Secondary | ICD-10-CM | POA: Diagnosis not present

## 2020-05-09 DIAGNOSIS — J189 Pneumonia, unspecified organism: Secondary | ICD-10-CM | POA: Diagnosis not present

## 2020-05-20 DIAGNOSIS — R059 Cough, unspecified: Secondary | ICD-10-CM | POA: Diagnosis not present

## 2020-05-20 DIAGNOSIS — J189 Pneumonia, unspecified organism: Secondary | ICD-10-CM | POA: Diagnosis not present

## 2020-05-20 DIAGNOSIS — R6889 Other general symptoms and signs: Secondary | ICD-10-CM | POA: Diagnosis not present

## 2020-06-05 DIAGNOSIS — G4734 Idiopathic sleep related nonobstructive alveolar hypoventilation: Secondary | ICD-10-CM | POA: Diagnosis not present

## 2020-06-27 DIAGNOSIS — M2580 Other specified joint disorders, unspecified joint: Secondary | ICD-10-CM | POA: Diagnosis not present

## 2020-06-27 DIAGNOSIS — M71572 Other bursitis, not elsewhere classified, left ankle and foot: Secondary | ICD-10-CM | POA: Diagnosis not present

## 2020-06-27 DIAGNOSIS — M25822 Other specified joint disorders, left elbow: Secondary | ICD-10-CM | POA: Diagnosis not present

## 2020-06-28 DIAGNOSIS — M79672 Pain in left foot: Secondary | ICD-10-CM | POA: Diagnosis not present

## 2020-06-28 DIAGNOSIS — M19072 Primary osteoarthritis, left ankle and foot: Secondary | ICD-10-CM | POA: Diagnosis not present

## 2020-06-28 DIAGNOSIS — M7732 Calcaneal spur, left foot: Secondary | ICD-10-CM | POA: Diagnosis not present

## 2020-06-28 DIAGNOSIS — M2012 Hallux valgus (acquired), left foot: Secondary | ICD-10-CM | POA: Diagnosis not present

## 2020-07-07 DIAGNOSIS — M2011 Hallux valgus (acquired), right foot: Secondary | ICD-10-CM | POA: Diagnosis not present

## 2020-07-07 DIAGNOSIS — M2012 Hallux valgus (acquired), left foot: Secondary | ICD-10-CM | POA: Diagnosis not present

## 2020-07-07 DIAGNOSIS — M79672 Pain in left foot: Secondary | ICD-10-CM | POA: Diagnosis not present

## 2020-07-08 DIAGNOSIS — R69 Illness, unspecified: Secondary | ICD-10-CM | POA: Diagnosis not present

## 2020-07-11 DIAGNOSIS — L814 Other melanin hyperpigmentation: Secondary | ICD-10-CM | POA: Diagnosis not present

## 2020-07-11 DIAGNOSIS — D225 Melanocytic nevi of trunk: Secondary | ICD-10-CM | POA: Diagnosis not present

## 2020-07-11 DIAGNOSIS — L570 Actinic keratosis: Secondary | ICD-10-CM | POA: Diagnosis not present

## 2020-07-11 DIAGNOSIS — L905 Scar conditions and fibrosis of skin: Secondary | ICD-10-CM | POA: Diagnosis not present

## 2020-07-11 DIAGNOSIS — L821 Other seborrheic keratosis: Secondary | ICD-10-CM | POA: Diagnosis not present

## 2020-07-11 DIAGNOSIS — Z85828 Personal history of other malignant neoplasm of skin: Secondary | ICD-10-CM | POA: Diagnosis not present

## 2020-08-02 DIAGNOSIS — L739 Follicular disorder, unspecified: Secondary | ICD-10-CM | POA: Diagnosis not present

## 2020-08-31 ENCOUNTER — Ambulatory Visit (HOSPITAL_COMMUNITY): Payer: Medicare PPO | Attending: Internal Medicine

## 2020-08-31 ENCOUNTER — Other Ambulatory Visit: Payer: Self-pay

## 2020-08-31 DIAGNOSIS — I351 Nonrheumatic aortic (valve) insufficiency: Secondary | ICD-10-CM | POA: Insufficient documentation

## 2020-08-31 LAB — ECHOCARDIOGRAM COMPLETE
Area-P 1/2: 3.17 cm2
P 1/2 time: 678 msec
S' Lateral: 3.5 cm

## 2020-09-01 NOTE — Telephone Encounter (Signed)
Called pt to review echo results and discuss the concens she sent in.  Reviewed s/s of severe aortic stenosis. The pt has concerns about her exercise tolerance.  This morning she was on the stair climber and could only go for 1 min before having to stop due to fatigue.  Did 2 min total.  3 months ago could do 5-7 min before a quick stopt to recover and then do more.  2 years ago 10 min was easy.   She is fatigued and not able to do the work she needs to do around the house. On work days she does nothing after working due to fatigue.  Since her last echo in 2021 she has been dx w sleep anpea and has been unable to tolerate any devices.  Dr. Kandyce Rud is her sleep medicine doctor.  She had pneumonia 6 months ago and was using o2 for a while at home. Currently on room air pox is 96 percent.   Pt aware I am forwarding to Dr. Clifton James to make him aware and we will call her back to let her know if he has any recommendations - if he thinks her symptoms could be related to her valve as she asks in her my chart message.

## 2020-09-02 ENCOUNTER — Telehealth (HOSPITAL_COMMUNITY): Payer: Self-pay | Admitting: Radiology

## 2020-09-02 ENCOUNTER — Telehealth: Payer: Self-pay | Admitting: *Deleted

## 2020-09-02 ENCOUNTER — Encounter (HOSPITAL_COMMUNITY): Payer: Self-pay | Admitting: Radiology

## 2020-09-02 NOTE — Telephone Encounter (Signed)
Spoke with patient regarding echocardiogram experience. I informed her I will follow up.

## 2020-09-02 NOTE — Telephone Encounter (Signed)
Received message from operator that pt calling for update or recommendations. Since operator still had pt on phone, adv operator to let the pt know I have not received any updates from Dr. Clifton James since we spoke yesterday.

## 2020-09-02 NOTE — Telephone Encounter (Deleted)
PT CALLING TO SEE IF NURSE IS AVAILABLE TO SPEAK BASED ON THEIR CONVERSATION YESTERDAY, WOULD LIKE TO KNOW DR. Karie Schwalbe RECOMMENDATION.

## 2020-09-02 NOTE — Telephone Encounter (Signed)
PT CALLING TO SEE IF NURSE IS AVAILABLE TO SPEAK BASED ON THEIR CONVERSATION YESTERDAY, WOULD LIKE TO KNOW DR. MCALHANYS RECOMMENDATION.  

## 2020-09-21 DIAGNOSIS — U071 COVID-19: Secondary | ICD-10-CM | POA: Diagnosis not present

## 2020-09-21 DIAGNOSIS — R059 Cough, unspecified: Secondary | ICD-10-CM | POA: Diagnosis not present

## 2020-09-21 DIAGNOSIS — J45909 Unspecified asthma, uncomplicated: Secondary | ICD-10-CM | POA: Diagnosis not present

## 2020-09-21 DIAGNOSIS — Z1152 Encounter for screening for COVID-19: Secondary | ICD-10-CM | POA: Diagnosis not present

## 2020-09-21 DIAGNOSIS — E039 Hypothyroidism, unspecified: Secondary | ICD-10-CM | POA: Diagnosis not present

## 2020-10-07 DIAGNOSIS — M5459 Other low back pain: Secondary | ICD-10-CM | POA: Diagnosis not present

## 2020-10-07 DIAGNOSIS — M25551 Pain in right hip: Secondary | ICD-10-CM | POA: Diagnosis not present

## 2020-10-10 DIAGNOSIS — M25551 Pain in right hip: Secondary | ICD-10-CM | POA: Diagnosis not present

## 2020-10-10 DIAGNOSIS — M545 Low back pain, unspecified: Secondary | ICD-10-CM | POA: Diagnosis not present

## 2020-10-14 DIAGNOSIS — M25551 Pain in right hip: Secondary | ICD-10-CM | POA: Diagnosis not present

## 2020-10-14 DIAGNOSIS — M545 Low back pain, unspecified: Secondary | ICD-10-CM | POA: Diagnosis not present

## 2020-10-15 DIAGNOSIS — M545 Low back pain, unspecified: Secondary | ICD-10-CM | POA: Diagnosis not present

## 2020-10-15 DIAGNOSIS — M25551 Pain in right hip: Secondary | ICD-10-CM | POA: Diagnosis not present

## 2020-10-18 DIAGNOSIS — M25551 Pain in right hip: Secondary | ICD-10-CM | POA: Diagnosis not present

## 2020-10-18 DIAGNOSIS — M545 Low back pain, unspecified: Secondary | ICD-10-CM | POA: Diagnosis not present

## 2020-10-19 DIAGNOSIS — M25551 Pain in right hip: Secondary | ICD-10-CM | POA: Diagnosis not present

## 2020-10-19 DIAGNOSIS — M545 Low back pain, unspecified: Secondary | ICD-10-CM | POA: Diagnosis not present

## 2020-10-21 DIAGNOSIS — M545 Low back pain, unspecified: Secondary | ICD-10-CM | POA: Diagnosis not present

## 2020-10-21 DIAGNOSIS — M25551 Pain in right hip: Secondary | ICD-10-CM | POA: Diagnosis not present

## 2020-10-24 DIAGNOSIS — M545 Low back pain, unspecified: Secondary | ICD-10-CM | POA: Diagnosis not present

## 2020-10-24 DIAGNOSIS — M25551 Pain in right hip: Secondary | ICD-10-CM | POA: Diagnosis not present

## 2020-10-31 DIAGNOSIS — Z961 Presence of intraocular lens: Secondary | ICD-10-CM | POA: Diagnosis not present

## 2020-10-31 DIAGNOSIS — H52203 Unspecified astigmatism, bilateral: Secondary | ICD-10-CM | POA: Diagnosis not present

## 2020-10-31 DIAGNOSIS — H35373 Puckering of macula, bilateral: Secondary | ICD-10-CM | POA: Diagnosis not present

## 2020-11-04 DIAGNOSIS — M25551 Pain in right hip: Secondary | ICD-10-CM | POA: Diagnosis not present

## 2020-11-04 DIAGNOSIS — M545 Low back pain, unspecified: Secondary | ICD-10-CM | POA: Diagnosis not present

## 2020-12-12 DIAGNOSIS — M7652 Patellar tendinitis, left knee: Secondary | ICD-10-CM | POA: Diagnosis not present

## 2020-12-14 ENCOUNTER — Other Ambulatory Visit: Payer: Self-pay

## 2020-12-14 ENCOUNTER — Ambulatory Visit (HOSPITAL_BASED_OUTPATIENT_CLINIC_OR_DEPARTMENT_OTHER): Payer: Medicare PPO | Attending: Family Medicine | Admitting: Physical Therapy

## 2020-12-14 ENCOUNTER — Encounter (HOSPITAL_BASED_OUTPATIENT_CLINIC_OR_DEPARTMENT_OTHER): Payer: Self-pay | Admitting: Physical Therapy

## 2020-12-14 DIAGNOSIS — R2689 Other abnormalities of gait and mobility: Secondary | ICD-10-CM | POA: Diagnosis not present

## 2020-12-14 DIAGNOSIS — M25551 Pain in right hip: Secondary | ICD-10-CM | POA: Diagnosis not present

## 2020-12-14 DIAGNOSIS — M25562 Pain in left knee: Secondary | ICD-10-CM | POA: Diagnosis not present

## 2020-12-15 ENCOUNTER — Encounter (HOSPITAL_BASED_OUTPATIENT_CLINIC_OR_DEPARTMENT_OTHER): Payer: Self-pay | Admitting: Physical Therapy

## 2020-12-15 NOTE — Patient Instructions (Signed)
Date: 12/15/2020 Prepared by: Lorayne Bender  Exercises Supine Bridge - 1 x daily - 7 x weekly - 3 sets - 10 reps Supine Quad Set - 1 x daily - 7 x weekly - 3 sets - 10 reps Supine Active Straight Leg Raise - 1 x daily - 7 x weekly - 3 sets - 10 reps

## 2020-12-15 NOTE — Therapy (Addendum)
[facility] [address] [place], [place], [address] Phone: [phone]   Fax:  [phone]  Physical Therapy Evaluation  Patient Details  Name: Erica Chase MRN: [id number] Date of Birth: [birth year] Referring Provider (PT): Dr Arlyce Harman   Encounter Date: 12/14/2020   PT End of Session - 12/15/20 1634     Visit Number 1    Number of Visits 12    Date for PT Re-Evaluation 01/26/21    Authorization Type [employer]    PT Start Time 0845    PT Stop Time 0927    PT Time Calculation (min) 42 min    Activity Tolerance Patient tolerated treatment well    Behavior During Therapy [facility] for tasks assessed/performed             Past Medical History:  Diagnosis Date   Abdominal pain 2004   ADD (attention deficit disorder) 08/2010   Allergic rhinitis 01/2011   Asthma    unsure-,reflux ruled out using nasonex and pulmocort   Atypical endocervical cells on Pap smear 1999   CIN III (cervical intraepithelial neoplasia grade III) with severe dysplasia 2001   FHx: cancer    HSV infection 1997   Hyperlipidemia 2018   Hypothyroidism    Libido, decreased 2011   Menopausal symptoms 2010   Orgasmic dysfunction 2006   Ovarian cyst    Pelvic relaxation 12/2000   PONV (postoperative nausea and vomiting)    scop patch has worked well in past   Sinusitis 01/2011   Stroke [facility])     Past Surgical History:  Procedure Laterality Date   CARPAL TUNNEL RELEASE     CATARACT EXTRACTION Left 2015   CHOLECYSTECTOMY  01/2011   KNEE ARTHROSCOPY     KNEE ARTHROSCOPY  03/05/2011   Procedure: ARTHROSCOPY KNEE;  Surgeon: Javier Docker;  Location: [facility];  Service: Orthopedics;  Laterality: Left;  left knee scope with debridement , Partial lateral meniscectomy   ROTATOR CUFF REPAIR     THUMB ARTHROSCOPY     thumb surgery      There were no vitals filed for this visit.    Subjective Assessment - 12/15/20 1659      Subjective Patient was getting in her car on the 29th of August. She has pain in her patellar tendon area. She has more pain when she sis going from sit to tand and when she goes up and down steps. She has no prior history of knee pain.    Pertinent History Stroke/TIA: aysymtomatic at this time 10-15 years ago; left knee pain    How long can you sit comfortably? Sit to stand    How long can you walk comfortably? ncreased pain on uneven surfaces    Diagnostic tests C-ray: negaitve per patient    Patient Stated Goals to have less pain    Currently in Pain? Yes    Pain Score 7     Pain Location Knee    Pain Orientation Right    Pain Descriptors / Indicators Aching    Pain Type Chronic pain    Pain Onset 1 to 4 weeks ago    Pain Frequency Intermittent    Aggravating Factors  walking on uneven surfaces    Pain Relieving Factors rest    Effect of Pain on Daily Activities difficulty going up and down steps    Multiple Pain Sites No  [facility] PT Assessment - 12/15/20 0001       Assessment   Medical Diagnosis Left Patellar Tendonitis    Referring Provider (PT) Dr Arlyce Harman    Onset Date/Surgical Date 11/28/20    Next MD Visit 4-6 weeks    Prior Therapy None      Precautions   Precautions None      Restrictions   Weight Bearing Restrictions No      Balance Screen   Has the patient fallen in the past 6 months No    Has the patient had a decrease in activity level because of a fear of falling?  No    Is the patient reluctant to leave their home because of a fear of falling?  No      Home Environment   Additional Comments 3 going in      Prior Function   Level of Independence Independent    Vocation Part time employment    Vocation Requirements massage therapist    Leisure hiking,      Cognition   Overall Cognitive Status Within Functional Limits for tasks assessed    Attention Focused    Focused Attention Appears intact    Memory Appears intact     Awareness Appears intact    Problem Solving Appears intact      Observation/Other Assessments   Skin Integrity mild bruising and a scar but patient reports this is baseline from an old injury      Sensation   Light Touch Appears Intact      Coordination   Gross Motor Movements are Fluid and Coordinated Yes    Fine Motor Movements are Fluid and Coordinated Yes      Functional Tests   Functional tests Squat;Single leg stance      Squat   Comments shifts away from right LE likley 2nd to baseline hip pain      Single Leg Stance   Comments limited on the left and right      AROM   Overall AROM Comments pain with end range left knee flexion      PROM   Overall PROM Comments pain with end range left knee flexion      Strength   Strength Assessment Site Hip;Knee    Right/Left Hip Right;Left    Right Hip Flexion 4+/5    Right Hip ABduction 5/5    Right Hip ADduction 5/5    Left Hip Flexion 4+/5    Left Hip ABduction 5/5    Left Hip ADduction 5/5    Right/Left Knee Right;Left    Right Knee Flexion 5/5    Right Knee Extension 5/5    Left Knee Flexion 5/5    Left Knee Extension 5/5      Palpation   Palpation comment tenderness to palpation on the medial portion of the left patella      Special Tests   Other special tests valgus stress (+) for anterior knee pain      Transfers   Comments pain transfering from sit to stand      Ambulation/Gait   Gait Comments no significant gait deviations                        Objective measurements completed on examination: See above findings.       [facility] Adult PT Treatment/Exercise - 12/15/20 0001       Exercises   Exercises Knee/Hip  Knee/Hip Exercises: Supine   Quad Sets Limitations reviewed for edeam control 3x10 sec hold    Bridges Limitations 3x10    Straight Leg Raises Limitations 3x10      Manual Therapy   Manual Therapy Taping    Manual therapy comments taping to lift patella and to improve  fluid flow areound the patella tendon    Kinesiotex Create Space                     PT Education - 12/15/20 1631     Education Details reviewed anatomy of condition and symptom management and activity modifaction    Person(s) Educated Patient    Methods Explanation;Demonstration;Tactile cues;Verbal cues    Comprehension Verbalized understanding;Returned demonstration;Verbal cues required;Tactile cues required              PT Short Term Goals - 12/15/20 1645       PT SHORT TERM GOAL #1   Title Patient will report a 50% reduction in tendereness to palpation of the patella    Time 3    Period Weeks    Status New    Target Date 01/05/21      PT SHORT TERM GOAL #2   Title Patient will demonstrate full pain free ROM of the left knee    Time 3    Period Weeks    Status New    Target Date 01/05/21      PT SHORT TERM GOAL #3   Title Patient will demonstrate 30 sec of left single leg stance without pain    Time 3    Period Weeks    Status New    Target Date 01/05/21               PT Long Term Goals - 12/15/20 1657       PT LONG TERM GOAL #1   Title Patient will transfer sit to stand without pain    Time 6    Period Weeks    Status New    Target Date 01/26/21      PT LONG TERM GOAL #2   Title Patient will go up/down steps without pain    Time 6    Period Weeks    Status New    Target Date 01/26/21      PT LONG TERM GOAL #3   Title Patient wll return to hiking without increased pain    Time 6    Period Weeks    Status New    Target Date 01/26/21                    Plan - 12/15/20 1635     Clinical Impression Statement Patient is a 75 year old female with left knee pain following a contusion getting in her car. She has increased pain transfering from sit to stand and going up and down steps/hills. Signs and symptoms are consistent with patellar tendinits. She has pain with end range flexion; valgus stress, and palpation of the  patellar tendon. She is very active and would like to return to her active lifestyle. She would benefit from skilled therapy to improve strength, stability, and ability to ambualte up/down hills.    Personal Factors and Comorbidities Comorbidity 1    Comorbidities left knee scop >10 years    Examination-Activity Limitations Stand;Stairs;Squat;Sit    Examination-Participation Restrictions Cleaning;Yard Work;Shop    Stability/Clinical Decision Making Evolving/Moderate complexity    Clinical Decision Making Low  Rehab Potential Excellent    PT Frequency 2x / week    PT Duration 6 weeks    PT Treatment/Interventions ADLs/Self Care Home Management;Cryotherapy;Electrical Stimulation;Moist Heat;Traction;Ultrasound;DME Instruction;Gait training;Stair training;Functional mobility training;Therapeutic activities;Therapeutic exercise;Neuromuscular re-education;Patient/family education;Manual techniques;Manual lymph drainage;Iontophoresis 4mg /ml Dexamethasone;Passive range of motion;Dry needling;Taping    PT Next Visit Plan begin with aquatic rehab and land rehab. Consider ebgining with progressive exercises and use anti-inflamatory modalites such as ionto and ultraspund if needed. Therapy trialed taping today.    PT Home Exercise Plan Date: 12/15/2020  Prepared by: 12/17/2020    Exercises  Supine Bridge - 1 x daily - 7 x weekly - 3 sets - 10 reps  Supine Quad Set - 1 x daily - 7 x weekly - 3 sets - 10 reps  Supine Active Straight Leg Raise - 1 x daily - 7 x weekly - 3 sets - 10 reps    Consulted and Agree with Plan of Care Patient             Patient will benefit from skilled therapeutic intervention in order to improve the following deficits and impairments:  Decreased mobility, Difficulty walking, Pain, Decreased strength, Decreased activity tolerance  Visit Diagnosis: Acute pain of left knee  Other abnormalities of gait and mobility   Referring diagnosis? M54.50 (ICD-10-CM) - Low back pain,  unspecified M76.52 (ICD-10-CM) - Patellar tendinitis, left knee Treatment diagnosis? (if different than referring diagnosis) Same  What was this (referring dx) caused by? []  Surgery []  Fall []  Ongoing issue []  Arthritis [x]  Other: ____Hit knee off the car ________  Laterality: []  Rt [x]  Lt []  Both  Check all possible CPT codes:      [x]  97110 (Therapeutic Exercise)  []  92507 (SLP Treatment)  [x]  Lorayne Bender (Neuro Re-ed)   []  (Swallowing Treatment)   [x]  (Gait Training)   []  (Cognitive Training, 1st 15 minutes) [x]  97140 (Manual Therapy)   []  97130 (Cognitive Training, each add'l 15 minutes)  [x]  97530 (Therapeutic Activities)  []  Other, List CPT Code ____________    [x]  97535 (Self Care)       []  All codes above (97110 - 97535)  []  97012 (Mechanical Traction)  [x]  97014 (E-stim Unattended)  []  97032 (E-stim manual)  [x]  97033 (Ionto)  [x]  97035 (Ultrasound)  []  97760 (Orthotic Fit) []  97750 (Physical Performance Training) [x]  (Aquatic Therapy) []  97034 (Contrast Bath) []  97018 (Paraffin) []  97597 (Wound Care 1st 20 sq cm) []  97598 (Wound Care each add'l 20 sq cm) [x]  97016 (Vasopneumatic Device) []  (Orthotic Training) []  (Prosthetic Training)   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   Follow-up examination after eye surgery 08/03/2019   Posterior capsular opacification non visually significant of right eye 08/03/2019   Right epiretinal membrane 08/03/2019   Pseudophakia of both eyes 08/03/2019   Left epiretinal membrane 08/03/2019   Hypothyroidism 08/28/2011   Menopausal symptoms 08/28/2011   Decreased libido 08/28/2011   Hypercholesterolemia 08/28/2011   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

## 2020-12-16 ENCOUNTER — Ambulatory Visit (HOSPITAL_BASED_OUTPATIENT_CLINIC_OR_DEPARTMENT_OTHER): Payer: Medicare PPO | Admitting: Physical Therapy

## 2020-12-16 ENCOUNTER — Other Ambulatory Visit: Payer: Self-pay

## 2020-12-16 ENCOUNTER — Encounter (HOSPITAL_BASED_OUTPATIENT_CLINIC_OR_DEPARTMENT_OTHER): Payer: Self-pay | Admitting: Physical Therapy

## 2020-12-16 DIAGNOSIS — R2689 Other abnormalities of gait and mobility: Secondary | ICD-10-CM

## 2020-12-16 DIAGNOSIS — M25551 Pain in right hip: Secondary | ICD-10-CM | POA: Diagnosis not present

## 2020-12-16 DIAGNOSIS — M25562 Pain in left knee: Secondary | ICD-10-CM | POA: Diagnosis not present

## 2020-12-16 NOTE — Therapy (Signed)
[facility] [address] [place], [place], [address] Phone: [phone]   Fax:  [phone]  Physical Therapy Treatment  Patient Details  Name: Erica Chase MRN: [id number] Date of Birth: 1945-04-24 Referring Provider (PT): Dr Arlyce Harman   Encounter Date: 12/16/2020   PT End of Session - 12/16/20 1359     Visit Number 2    Number of Visits 12    Date for PT Re-Evaluation 01/26/21    Authorization Type [employer]    PT Start Time [phone]   therapy late   PT Stop Time 0930    PT Time Calculation (min) 39 min    Activity Tolerance Patient tolerated treatment well    Behavior During Therapy [facility] for tasks assessed/performed             Past Medical History:  Diagnosis Date   Abdominal pain 2004   ADD (attention deficit disorder) 08/2010   Allergic rhinitis 01/2011   Asthma    unsure-,reflux ruled out using nasonex and pulmocort   Atypical endocervical cells on Pap smear 1999   CIN III (cervical intraepithelial neoplasia grade III) with severe dysplasia 2001   FHx: cancer    HSV infection 1997   Hyperlipidemia 2018   Hypothyroidism    Libido, decreased 2011   Menopausal symptoms 2010   Orgasmic dysfunction 2006   Ovarian cyst    Pelvic relaxation 12/2000   PONV (postoperative nausea and vomiting)    scop patch has worked well in past   Sinusitis 01/2011   Stroke [facility])     Past Surgical History:  Procedure Laterality Date   CARPAL TUNNEL RELEASE     CATARACT EXTRACTION Left 2015   CHOLECYSTECTOMY  01/2011   KNEE ARTHROSCOPY     KNEE ARTHROSCOPY  03/05/2011   Procedure: ARTHROSCOPY KNEE;  Surgeon: Javier Docker;  Location: [facility];  Service: Orthopedics;  Laterality: Left;  left knee scope with debridement , Partial lateral meniscectomy   ROTATOR CUFF REPAIR     THUMB ARTHROSCOPY     thumb surgery      There were no vitals filed for this visit.   Subjective Assessment - 12/16/20  1356     Subjective Patient reports her knee is not hurting much.  Her right hip is giving her a hard time. She reports her pain is about a 7-8/10. She has a script for her hip. We will re-assess on land.    Pertinent History Stroke/TIA: aysymtomatic at this time 10-15 years ago; left knee pain    How long can you sit comfortably? Sit to stand    How long can you walk comfortably? ncreased pain on uneven surfaces    Diagnostic tests C-ray: negaitve per patient    Patient Stated Goals to have less pain    Currently in Pain? Yes    Pain Score 7     Pain Location Hip    Pain Orientation Left    Pain Descriptors / Indicators Aching    Pain Type Chronic pain    Pain Onset 1 to 4 weeks ago    Pain Frequency Intermittent    Aggravating Factors  walking on uneven surfaces    Pain Relieving Factors rest    Effect of Pain on Daily Activities difficulty going up and down steps              Pt seen for aquatic therapy today.  Treatment took place in water 3.25-4 ft  in depth at the [facility] pool. Temp of water was 91.  Pt entered/exited the pool via stairs (step through pattern) independently with bilat rail.  Introduction to water. Had patient stand at different levels so she could feel the buoyancy   Warm up: heel/toe walking x4 laps across pool chest deep side stepping x4 laps from shallow to deep;   Ambulation with noodle support: long strides, march emphasized normal gait without compensation   Exercises; Slow march x20; squats x20 with cuing to keep hips back; hip extension x20; hip abduction x20; Sit to stand x20;    board trunk flexion x10 lateral board rotation x10 each way seated   With neck noddle and foot float; push pull x20; side to side perturbations from the feet x20; press and push x20  Steps step up x20 each leg; lateral step up x20 each leg;   Seated LAQ and March x20 2lbs   Noodle push and pull x20 with core breathing; noodle press x20 with core  breathing    Pt requires buoyancy for support and to offload joints with strengthening exercises. Viscosity of the water is needed for resistance of strengthening; water current perturbations provides challenge to standing balance unsupported, requiring increased core activation.                          PT Education - 12/16/20 1359     Education Details HEP and symptom mangement    Person(s) Educated Patient    Methods Explanation;Demonstration;Tactile cues;Verbal cues    Comprehension Verbalized understanding;Returned demonstration;Verbal cues required;Tactile cues required              PT Short Term Goals - 12/15/20 1645       PT SHORT TERM GOAL #1   Title Patient will report a 50% reduction in tendereness to palpation of the patella    Time 3    Period Weeks    Status New    Target Date 01/05/21      PT SHORT TERM GOAL #2   Title Patient will demonstrate full pain free ROM of the left knee    Time 3    Period Weeks    Status New    Target Date 01/05/21      PT SHORT TERM GOAL #3   Title Patient will demonstrate 30 sec of left single leg stance without pain    Time 3    Period Weeks    Status New    Target Date 01/05/21               PT Long Term Goals - 12/15/20 1657       PT LONG TERM GOAL #1   Title Patient will transfer sit to stand without pain    Time 6    Period Weeks    Status New    Target Date 01/26/21      PT LONG TERM GOAL #2   Title Patient will go up/down steps without pain    Time 6    Period Weeks    Status New    Target Date 01/26/21      PT LONG TERM GOAL #3   Title Patient wll return to hiking without increased pain    Time 6    Period Weeks    Status New    Target Date 01/26/21  Plan - 12/16/20 1427     Clinical Impression Statement Patient tolerated treatment well. She could feel her knee in certain positions but for the most part her knee tolerated treatment well. She  had a little more pain with the hip, but again it was tolerable. Therapy will continue to progress stability exercises. We will likley eval patients hip next visit.    Personal Factors and Comorbidities Comorbidity 1    Comorbidities left knee scop >10 years    Examination-Activity Limitations Stand;Stairs;Squat;Sit    Examination-Participation Restrictions Cleaning;Yard Work;Shop    Stability/Clinical Decision Making Evolving/Moderate complexity    Clinical Decision Making Low    Rehab Potential Excellent    PT Frequency 2x / week    PT Duration 6 weeks    PT Treatment/Interventions ADLs/Self Care Home Management;Cryotherapy;Electrical Stimulation;Moist Heat;Traction;Ultrasound;DME Instruction;Gait training;Stair training;Functional mobility training;Therapeutic activities;Therapeutic exercise;Neuromuscular re-education;Patient/family education;Manual techniques;Manual lymph drainage;Iontophoresis 4mg /ml Dexamethasone;Passive range of motion;Dry needling;Taping    PT Next Visit Plan begin with aquatic rehab and land rehab. Consider ebgining with progressive exercises and use anti-inflamatory modalites such as ionto and ultraspund if needed. Therapy trialed taping today.    PT Home Exercise Plan Date: 12/15/2020  Prepared by: 12/17/2020    Exercises  Supine Bridge - 1 x daily - 7 x weekly - 3 sets - 10 reps  Supine Quad Set - 1 x daily - 7 x weekly - 3 sets - 10 reps  Supine Active Straight Leg Raise - 1 x daily - 7 x weekly - 3 sets - 10 reps    Consulted and Agree with Plan of Care Patient             Patient will benefit from skilled therapeutic intervention in order to improve the following deficits and impairments:  Decreased mobility, Difficulty walking, Pain, Decreased strength, Decreased activity tolerance  Visit Diagnosis: Acute pain of left knee  Other abnormalities of gait and mobility     Problem List Patient Active Problem List   Diagnosis Date Noted   Snoring  12/23/2019   Sleep related hypoxia 12/23/2019   Severe sleep apnea 12/23/2019   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   Follow-up examination after eye surgery 08/03/2019   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 [place], [place], [place] Phone: [phone]   Fax:  [phone]  Name: Erica Chase MRN: [name] Date of Birth: 07-27-1945

## 2020-12-19 ENCOUNTER — Encounter (HOSPITAL_BASED_OUTPATIENT_CLINIC_OR_DEPARTMENT_OTHER): Payer: Self-pay | Admitting: Physical Therapy

## 2020-12-19 ENCOUNTER — Other Ambulatory Visit: Payer: Self-pay

## 2020-12-19 ENCOUNTER — Ambulatory Visit (HOSPITAL_BASED_OUTPATIENT_CLINIC_OR_DEPARTMENT_OTHER): Payer: Medicare PPO | Admitting: Physical Therapy

## 2020-12-19 DIAGNOSIS — R2689 Other abnormalities of gait and mobility: Secondary | ICD-10-CM

## 2020-12-19 DIAGNOSIS — M25551 Pain in right hip: Secondary | ICD-10-CM

## 2020-12-19 DIAGNOSIS — M25562 Pain in left knee: Secondary | ICD-10-CM | POA: Diagnosis not present

## 2020-12-20 ENCOUNTER — Encounter (HOSPITAL_BASED_OUTPATIENT_CLINIC_OR_DEPARTMENT_OTHER): Payer: Self-pay | Admitting: Physical Therapy

## 2020-12-20 NOTE — Therapy (Signed)
[facility] [address] [place], [place], [address] Phone: [phone]   Fax:  [phone]  Physical Therapy Treatment/Re-eval of the hip   Patient Details  Name: JESSYKA AUSTRIA MRN: [id number] Date of Birth: 10/31/1945 Referring Provider (PT): Dr Cherlynn June   Encounter Date: 12/19/2020   PT End of Session - 12/19/20 1000     Visit Number 3    Number of Visits 15    Date for PT Re-Evaluation 01/30/21    Authorization Type [employer]    PT Start Time 0845    PT Stop Time 0936    PT Time Calculation (min) 51 min    Activity Tolerance Patient tolerated treatment well    Behavior During Therapy [facility] for tasks assessed/performed             Past Medical History:  Diagnosis Date   Abdominal pain 2004   ADD (attention deficit disorder) 08/2010   Allergic rhinitis 01/2011   Asthma    unsure-,reflux ruled out using nasonex and pulmocort   Atypical endocervical cells on Pap smear 1999   CIN III (cervical intraepithelial neoplasia grade III) with severe dysplasia 2001   FHx: cancer    HSV infection 1997   Hyperlipidemia 2018   Hypothyroidism    Libido, decreased 2011   Menopausal symptoms 2010   Orgasmic dysfunction 2006   Ovarian cyst    Pelvic relaxation 12/2000   PONV (postoperative nausea and vomiting)    scop patch has worked well in past   Sinusitis 01/2011   Stroke [facility])     Past Surgical History:  Procedure Laterality Date   CARPAL TUNNEL RELEASE     CATARACT EXTRACTION Left 2015   CHOLECYSTECTOMY  01/2011   KNEE ARTHROSCOPY     KNEE ARTHROSCOPY  03/05/2011   Procedure: ARTHROSCOPY KNEE;  Surgeon: Javier Docker;  Location: [facility];  Service: Orthopedics;  Laterality: Left;  left knee scope with debridement , Partial lateral meniscectomy   ROTATOR CUFF REPAIR     THUMB ARTHROSCOPY     thumb surgery      There were no vitals filed for this visit.   Subjective Assessment -  12/19/20 0900     Subjective Patient got up on a ladder over the weekend and felt a pop in her knee. She has had significant pain in her knee since that point. She was able to get in the pool and exercises bit out of the pool she has had pain. The pain in her    Pertinent History Stroke/TIA: aysymtomatic at this time 10-15 years ago; left knee pain    How long can you sit comfortably? Sit to stand    How long can you walk comfortably? ncreased pain on uneven surfaces    Diagnostic tests C-ray: negaitve per patient    Patient Stated Goals to have less pain    Currently in Pain? Yes    Pain Score 7     Pain Location Hip    Pain Orientation Left    Pain Descriptors / Indicators Aching    Pain Type Chronic pain    Pain Onset 1 to 4 weeks ago    Aggravating Factors  sitting for too long    Pain Relieving Factors movement    Multiple Pain Sites Yes    Pain Score 9    Pain Location Knee    Pain Orientation Left    Pain Descriptors / Indicators Aching  Pain Type Chronic pain                [facility] PT Assessment - 12/20/20 0001       Assessment   Medical Diagnosis Left Patellar Tendonitis/ Right hip and low back pain    Referring Provider (PT) Dr Cherlynn June    Onset Date/Surgical Date 11/28/20    Next MD Visit 4-6 weeks    Prior Therapy None      Precautions   Precautions None      Restrictions   Weight Bearing Restrictions No      Balance Screen   Has the patient fallen in the past 6 months No    Has the patient had a decrease in activity level because of a fear of falling?  No    Is the patient reluctant to leave their home because of a fear of falling?  No      PROM   Overall PROM Comments full passive right hip motion      Strength   Right Hip Flexion 4+/5    Right Hip ABduction 5/5    Right Hip ADduction 5/5    Left Hip Flexion 4/5    Left Hip ABduction 5/5    Left Hip ADduction 5/5      Palpation   Palpation comment tenderness to palpation on the  medial portion of the left patella; Tneder to palpation along the PSIS and in the trochanteric bursa.      Transfers   Comments pain transfering from sit to stand      Ambulation/Gait   Gait Comments decreased weight bearing on the left LE compared to intial                           [facility] Adult PT Treatment/Exercise - 12/20/20 0001       Modalities   Modalities Iontophoresis      Iontophoresis   Type of Iontophoresis Dexamethasone    Location left patella    Dose 1cc    Time slow release      Manual Therapy   Manual Therapy Joint mobilization;Soft tissue mobilization;Manual Traction    Soft tissue mobilization to low back and gluteal; significant spasming along the PSIS    Manual Traction Grade II-> grade 4 occilations in the right hip to decrease inflamation                     PT Education - 12/19/20 0904     Education Details reviewed HEP for the hip    Person(s) Educated Patient    Methods Explanation;Demonstration;Tactile cues;Verbal cues    Comprehension Verbalized understanding;Returned demonstration;Verbal cues required;Tactile cues required              PT Short Term Goals - 12/19/20 1010       PT SHORT TERM GOAL #1   Title Patient will report a 50% reduction in tendereness to palpation of the patella    Time 3    Period Weeks    Status On-going    Target Date 01/05/21      PT SHORT TERM GOAL #2   Title Patient will demonstrate full pain free ROM of the left knee    Time 3    Period Weeks    Status On-going    Target Date 01/05/21      PT SHORT TERM GOAL #3   Title Patient will demonstrate 30 sec of  left single leg stance without pain    Time 3    Period Weeks    Status On-going    Target Date 01/05/21      PT SHORT TERM GOAL #4   Title Patient will increase right hip flexion strength to 4+/5    Time 3    Period Weeks    Status New    Target Date 01/09/21               PT Long Term Goals - 12/19/20  1013       PT LONG TERM GOAL #1   Title Patient will transfer sit to stand without pain    Baseline continues to have tightness in the knee and hip    Time 6    Period Weeks    Status On-going    Target Date 01/30/21      PT LONG TERM GOAL #2   Title Patient will go up/down steps without pain    Baseline has significant pain since her knee popped    Time 6    Period Weeks    Status On-going    Target Date 01/30/21      PT LONG TERM GOAL #3   Title Patient wll return to hiking without increased pain    Baseline unable to hike    Time 6    Period Weeks    Status On-going    Target Date 01/30/21                   Plan - 12/19/20 1000     Clinical Impression Statement Patient has tenderness to palpation in her medial patella area. Therapy used ionto to decrease inflamtion in the area. she was advised if it dosne't improve over the next day or two to contact her MD. Therapy assessed her hip today. She has full ROM in her hip. She has hip flexor weakness. She has significant spasming along her PSIS and around her troachnter. Therapy discussed TPDN with her but she is not intrested. Therapy perfroemd LAD with occilations. She reported not significant change, but she could feel it but it was not significantly better. Patient would benefit most from pool exercises right now. She has hip exercises from her last POC. We will see her on Thursday for her pool session, unless she is going back to the MD for follow up on her knee. Patient educated in doning/doffing of the ionto patch.    Personal Factors and Comorbidities Comorbidity 1    Comorbidities left knee scop >10 years    Examination-Activity Limitations Stand;Stairs;Squat;Sit    Examination-Participation Restrictions Cleaning;Yard Work;Shop    Stability/Clinical Decision Making Evolving/Moderate complexity    Clinical Decision Making Low    Rehab Potential Excellent    PT Frequency 2x / week    PT Duration 6 weeks    PT  Treatment/Interventions ADLs/Self Care Home Management;Cryotherapy;Electrical Stimulation;Moist Heat;Traction;Ultrasound;DME Instruction;Gait training;Stair training;Functional mobility training;Therapeutic activities;Therapeutic exercise;Neuromuscular re-education;Patient/family education;Manual techniques;Manual lymph drainage;Iontophoresis 4mg /ml Dexamethasone;Passive range of motion;Dry needling;Taping    PT Next Visit Plan begin with aquatic rehab and land rehab. Consider ebgining with progressive exercises and use anti-inflamatory modalites such as ionto and ultraspund if needed. Therapy trialed taping today.    PT Home Exercise Plan Date: 12/15/2020  Prepared by: 12/17/2020    Exercises  Supine Bridge - 1 x daily - 7 x weekly - 3 sets - 10 reps  Supine Quad Set - 1 x daily - 7 x weekly -  3 sets - 10 reps  Supine Active Straight Leg Raise - 1 x daily - 7 x weekly - 3 sets - 10 reps    Consulted and Agree with Plan of Care Patient             Patient will benefit from skilled therapeutic intervention in order to improve the following deficits and impairments:  Decreased mobility, Difficulty walking, Pain, Decreased strength, Decreased activity tolerance  Visit Diagnosis: Acute pain of left knee  Other abnormalities of gait and mobility  Pain in right hip     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   Follow-up examination after eye surgery 08/03/2019   Posterior capsular opacification non visually significant of right eye 08/03/2019   Right epiretinal membrane 08/03/2019   Pseudophakia of both eyes 08/03/2019   Left epiretinal membrane 08/03/2019   Hypothyroidism 08/28/2011   Menopausal symptoms 08/28/2011   Decreased libido 08/28/2011   Hypercholesterolemia 08/28/2011   HSV-2 (herpes simplex virus 2)  infection 08/28/2011   CIN III with severe dysplasia 08/28/2011    Dessie Coma, PT DPT  12/20/2020, 10:05 AM  [facility] [address] [place], [place], [address] Phone: [phone]   Fax:  [phone]  Name: MONZERAT HANDLER MRN: [id number] Date of Birth: 1945-04-09

## 2020-12-22 ENCOUNTER — Other Ambulatory Visit: Payer: Self-pay

## 2020-12-22 ENCOUNTER — Ambulatory Visit (HOSPITAL_BASED_OUTPATIENT_CLINIC_OR_DEPARTMENT_OTHER): Payer: Medicare PPO | Admitting: Physical Therapy

## 2020-12-22 DIAGNOSIS — M25562 Pain in left knee: Secondary | ICD-10-CM

## 2020-12-22 DIAGNOSIS — M25551 Pain in right hip: Secondary | ICD-10-CM

## 2020-12-22 DIAGNOSIS — R2689 Other abnormalities of gait and mobility: Secondary | ICD-10-CM

## 2020-12-22 NOTE — Therapy (Signed)
[facility] [address] [place], [place], [address] Phone: [phone]   Fax:  [phone]  Physical Therapy Treatment  Patient Details  Name: Erica Chase MRN: [id number] Date of Birth: 06/10/1945 Referring Provider (PT): Dr Cherlynn June   Encounter Date: 12/22/2020   PT End of Session - 12/22/20 1230     Visit Number 4    Number of Visits 15    Date for PT Re-Evaluation 01/30/21    Authorization Type [employer]    PT Start Time 0930    PT Stop Time 1014    PT Time Calculation (min) 44 min    Activity Tolerance Patient tolerated treatment well    Behavior During Therapy [facility] for tasks assessed/performed             Past Medical History:  Diagnosis Date   Abdominal pain 2004   ADD (attention deficit disorder) 08/2010   Allergic rhinitis 01/2011   Asthma    unsure-,reflux ruled out using nasonex and pulmocort   Atypical endocervical cells on Pap smear 1999   CIN III (cervical intraepithelial neoplasia grade III) with severe dysplasia 2001   FHx: cancer    HSV infection 1997   Hyperlipidemia 2018   Hypothyroidism    Libido, decreased 2011   Menopausal symptoms 2010   Orgasmic dysfunction 2006   Ovarian cyst    Pelvic relaxation 12/2000   PONV (postoperative nausea and vomiting)    scop patch has worked well in past   Sinusitis 01/2011   Stroke [facility])     Past Surgical History:  Procedure Laterality Date   CARPAL TUNNEL RELEASE     CATARACT EXTRACTION Left 2015   CHOLECYSTECTOMY  01/2011   KNEE ARTHROSCOPY     KNEE ARTHROSCOPY  03/05/2011   Procedure: ARTHROSCOPY KNEE;  Surgeon: Javier Docker;  Location: [facility];  Service: Orthopedics;  Laterality: Left;  left knee scope with debridement , Partial lateral meniscectomy   ROTATOR CUFF REPAIR     THUMB ARTHROSCOPY     thumb surgery      There were no vitals filed for this visit.   Subjective Assessment - 12/22/20 1056      Subjective Patient continues to have significant pain in her knee going up and down steps. It has not changed. she has contacted her MD.    Pertinent History Stroke/TIA: aysymtomatic at this time 10-15 years ago; left knee pain    How long can you sit comfortably? Sit to stand    How long can you walk comfortably? ncreased pain on uneven surfaces    Diagnostic tests C-ray: negaitve per patient    Patient Stated Goals to have less pain    Currently in Pain? Yes    Pain Score --   no specific pain level given but 6 on the faces.                      Pt seen for aquatic therapy today.  Treatment took place in water 3.25-4 ft in depth at the [facility] pool. Temp of water was 91.  Pt entered/exited the pool via stairs (step through pattern) independently with bilat rail.   Introduction to water. Had patient stand at different levels so she could feel the buoyancy    Warm up: heel/toe walking x4 laps across pool chest deep side stepping x4 laps from shallow to deep;    Ambulation with noodle support: long strides, march  emphasized normal gait without compensation    Exercises at wall chest deep water noodle arc standing blue x20 each leg; yellow half arc x20   Seated: laq in limited range left 2lb full range right   Seated leg press blue 2x10   board trunk flexion x10 lateral board rotation x10 each way seated    With neck noddle and foot float; bicycle 3 min; double leg press and pull  2 min  Steps step up x20 each leg; lateral step up x20 each leg;    Seated LAQ and March x20 2lbs    Noodle push and pull x20 with core breathing; noodle press x20 with core breathing      Pt requires buoyancy for support and to offload joints with strengthening exercises. Viscosity of the water is needed for resistance of strengthening; water current perturbations provides challenge to standing balance unsupported, requiring increased core  activation.                   PT Education - 12/22/20 1229     Education Details reviewed hip strengthening exercises    Person(s) Educated Patient    Methods Explanation;Demonstration;Tactile cues;Verbal cues    Comprehension Verbalized understanding;Returned demonstration;Verbal cues required;Tactile cues required              PT Short Term Goals - 12/19/20 1010       PT SHORT TERM GOAL #1   Title Patient will report a 50% reduction in tendereness to palpation of the patella    Time 3    Period Weeks    Status On-going    Target Date 01/05/21      PT SHORT TERM GOAL #2   Title Patient will demonstrate full pain free ROM of the left knee    Time 3    Period Weeks    Status On-going    Target Date 01/05/21      PT SHORT TERM GOAL #3   Title Patient will demonstrate 30 sec of left single leg stance without pain    Time 3    Period Weeks    Status On-going    Target Date 01/05/21      PT SHORT TERM GOAL #4   Title Patient will increase right hip flexion strength to 4+/5    Time 3    Period Weeks    Status New    Target Date 01/09/21               PT Long Term Goals - 12/19/20 1013       PT LONG TERM GOAL #1   Title Patient will transfer sit to stand without pain    Baseline continues to have tightness in the knee and hip    Time 6    Period Weeks    Status On-going    Target Date 01/30/21      PT LONG TERM GOAL #2   Title Patient will go up/down steps without pain    Baseline has significant pain since her knee popped    Time 6    Period Weeks    Status On-going    Target Date 01/30/21      PT LONG TERM GOAL #3   Title Patient wll return to hiking without increased pain    Baseline unable to hike    Time 6    Period Weeks    Status On-going    Target Date 01/30/21  Plan - 12/22/20 1254     Clinical Impression Statement Patient had increased pain with several of the knee exercises. She had pain  with 4 inch step up in chest deep water. She was advised it is time to get that checked out following the pop. She wanted to try the ionto but opted for the full session ( we would have had to end 10 min early to dry and get the patch) Therapy reviewed hip extension and flexion exercises using the noodle under water. She was also shown single leg and double leg core exercises from a folat position. We will continue to work on her hip, but she was advised to get her knee checked out.    Personal Factors and Comorbidities Comorbidity 1    Comorbidities left knee scop >10 years    Examination-Activity Limitations Stand;Stairs;Squat;Sit    Examination-Participation Restrictions Cleaning;Yard Work;Shop    Stability/Clinical Decision Making Evolving/Moderate complexity    Clinical Decision Making Low    Rehab Potential Excellent    PT Frequency 2x / week    PT Duration 6 weeks    PT Treatment/Interventions ADLs/Self Care Home Management;Cryotherapy;Electrical Stimulation;Moist Heat;Traction;Ultrasound;DME Instruction;Gait training;Stair training;Functional mobility training;Therapeutic activities;Therapeutic exercise;Neuromuscular re-education;Patient/family education;Manual techniques;Manual lymph drainage;Iontophoresis 4mg /ml Dexamethasone;Passive range of motion;Dry needling;Taping    PT Next Visit Plan begin with aquatic rehab and land rehab. Consider ebgining with progressive exercises and use anti-inflamatory modalites such as ionto and ultraspund if needed. Therapy trialed taping today.    PT Home Exercise Plan Date: 12/15/2020  Prepared by: 12/17/2020    Exercises  Supine Bridge - 1 x daily - 7 x weekly - 3 sets - 10 reps  Supine Quad Set - 1 x daily - 7 x weekly - 3 sets - 10 reps  Supine Active Straight Leg Raise - 1 x daily - 7 x weekly - 3 sets - 10 reps    Consulted and Agree with Plan of Care Patient             Patient will benefit from skilled therapeutic intervention in order to  improve the following deficits and impairments:  Decreased mobility, Difficulty walking, Pain, Decreased strength, Decreased activity tolerance  Visit Diagnosis: Acute pain of left knee  Other abnormalities of gait and mobility  Pain in right hip     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   Follow-up examination after eye surgery 08/03/2019   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 [place], [place], [place] Phone: [phone]   Fax:  [phone]  Name: Erica Chase MRN: [name] Date of Birth: 1945-11-22

## 2020-12-25 DIAGNOSIS — M25562 Pain in left knee: Secondary | ICD-10-CM | POA: Diagnosis not present

## 2020-12-26 ENCOUNTER — Ambulatory Visit (HOSPITAL_BASED_OUTPATIENT_CLINIC_OR_DEPARTMENT_OTHER): Payer: Medicare PPO | Admitting: Physical Therapy

## 2020-12-26 ENCOUNTER — Encounter (HOSPITAL_BASED_OUTPATIENT_CLINIC_OR_DEPARTMENT_OTHER): Payer: Self-pay | Admitting: Physical Therapy

## 2020-12-26 ENCOUNTER — Other Ambulatory Visit: Payer: Self-pay

## 2020-12-26 DIAGNOSIS — M25562 Pain in left knee: Secondary | ICD-10-CM | POA: Diagnosis not present

## 2020-12-26 DIAGNOSIS — M25551 Pain in right hip: Secondary | ICD-10-CM

## 2020-12-26 DIAGNOSIS — R2689 Other abnormalities of gait and mobility: Secondary | ICD-10-CM | POA: Diagnosis not present

## 2020-12-26 NOTE — Therapy (Signed)
[facility] [address] [place], [place], [address] Phone: [phone]   Fax:  [phone]  Physical Therapy Treatment  Patient Details  Name: Erica Chase MRN: [id number] Date of Birth: May 19, 1945 Referring Provider (PT): Dr Cherlynn June   Encounter Date: 12/26/2020   PT End of Session - 12/26/20 2038     Visit Number 5    Number of Visits 15    Date for PT Re-Evaluation 01/30/21    Authorization Type [employer]    PT Start Time 0845    PT Stop Time 0927    PT Time Calculation (min) 42 min    Activity Tolerance Patient tolerated treatment well    Behavior During Therapy [facility] for tasks assessed/performed             Past Medical History:  Diagnosis Date   Abdominal pain 2004   ADD (attention deficit disorder) 08/2010   Allergic rhinitis 01/2011   Asthma    unsure-,reflux ruled out using nasonex and pulmocort   Atypical endocervical cells on Pap smear 1999   CIN III (cervical intraepithelial neoplasia grade III) with severe dysplasia 2001   FHx: cancer    HSV infection 1997   Hyperlipidemia 2018   Hypothyroidism    Libido, decreased 2011   Menopausal symptoms 2010   Orgasmic dysfunction 2006   Ovarian cyst    Pelvic relaxation 12/2000   PONV (postoperative nausea and vomiting)    scop patch has worked well in past   Sinusitis 01/2011   Stroke [facility])     Past Surgical History:  Procedure Laterality Date   CARPAL TUNNEL RELEASE     CATARACT EXTRACTION Left 2015   CHOLECYSTECTOMY  01/2011   KNEE ARTHROSCOPY     KNEE ARTHROSCOPY  03/05/2011   Procedure: ARTHROSCOPY KNEE;  Surgeon: Javier Docker;  Location: [facility];  Service: Orthopedics;  Laterality: Left;  left knee scope with debridement , Partial lateral meniscectomy   ROTATOR CUFF REPAIR     THUMB ARTHROSCOPY     thumb surgery      There were no vitals filed for this visit.   Subjective Assessment - 12/26/20 1052      Subjective Patient continues to report the same pain going up  and down steps in the knee. The hip has been burning. She has been working in the pool. She hads had her MRI.    Pertinent History Stroke/TIA: aysymtomatic at this time 10-15 years ago; left knee pain    How long can you sit comfortably? Sit to stand    How long can you walk comfortably? ncreased pain on uneven surfaces    Diagnostic tests C-ray: negaitve per patient    Patient Stated Goals to have less pain    Currently in Pain? No/denies   Pain in her knee when going up and down steps                Pt seen for aquatic therapy today.  Treatment took place in water 3.25-4 ft in depth at the [facility] pool. Temp of water was 91.  Pt entered/exited the pool via stairs (step through pattern) independently with bilat rail.   Introduction to water. Had patient stand at different levels so she could feel the buoyancy    Warm up: heel/toe walking x4 laps across pool chest deep side stepping x4 laps from shallow to deep;    Ambulation with noodle support: long strides, march emphasized normal  gait without compensation       Seated: laq in limited range left 2lb full range right    Seated leg press blue 2x10   board trunk flexion x10 lateral board rotation x10 each way seated    Steps step up x20 each leg; lateral step up x20 each leg;   better tolerance on the left   Floating with noodle Single leg march x20  Double leg pull up x20  Balance  Tandem stance  Box step x10  Single leg stance 2x20 sec hold bilateral  Tandem walk 2 laps   Seated noodle press x20 each leg     Pt requires buoyancy for support and to offload joints with strengthening exercises. Viscosity of the water is needed for resistance of strengthening; water current perturbations provides challenge to standing balance unsupported, requiring increased core activation.                        PT Education - 12/26/20  2038     Education Details reviewed benefits of balance exercises    Person(s) Educated Patient    Methods Explanation;Demonstration;Tactile cues;Verbal cues    Comprehension Verbalized understanding;Verbal cues required;Returned demonstration;Tactile cues required              PT Short Term Goals - 12/19/20 1010       PT SHORT TERM GOAL #1   Title Patient will report a 50% reduction in tendereness to palpation of the patella    Time 3    Period Weeks    Status On-going    Target Date 01/05/21      PT SHORT TERM GOAL #2   Title Patient will demonstrate full pain free ROM of the left knee    Time 3    Period Weeks    Status On-going    Target Date 01/05/21      PT SHORT TERM GOAL #3   Title Patient will demonstrate 30 sec of left single leg stance without pain    Time 3    Period Weeks    Status On-going    Target Date 01/05/21      PT SHORT TERM GOAL #4   Title Patient will increase right hip flexion strength to 4+/5    Time 3    Period Weeks    Status New    Target Date 01/09/21               PT Long Term Goals - 12/19/20 1013       PT LONG TERM GOAL #1   Title Patient will transfer sit to stand without pain    Baseline continues to have tightness in the knee and hip    Time 6    Period Weeks    Status On-going    Target Date 01/30/21      PT LONG TERM GOAL #2   Title Patient will go up/down steps without pain    Baseline has significant pain since her knee popped    Time 6    Period Weeks    Status On-going    Target Date 01/30/21      PT LONG TERM GOAL #3   Title Patient wll return to hiking without increased pain    Baseline unable to hike    Time 6    Period Weeks    Status On-going    Target Date 01/30/21  Plan - 12/26/20 2042     Clinical Impression Statement Patient otlerated steps better today but she is still having pain in her knee. Therapy focesed on stbility exercises to keep her knee from getting  exacerbated. Her lhip should also benefit from stability, balance, and coordination exercises. She was emailed her new exercises from today. We will proceed per    Personal Factors and Comorbidities Comorbidity 1    Comorbidities left knee scop >10 years    Examination-Activity Limitations Stand;Stairs;Squat;Sit    Examination-Participation Restrictions Cleaning;Yard Work;Shop    Stability/Clinical Decision Making Evolving/Moderate complexity    Clinical Decision Making Low    Rehab Potential Excellent    PT Frequency 2x / week    PT Duration 6 weeks    PT Treatment/Interventions ADLs/Self Care Home Management;Cryotherapy;Electrical Stimulation;Moist Heat;Traction;Ultrasound;DME Instruction;Gait training;Stair training;Functional mobility training;Therapeutic activities;Therapeutic exercise;Neuromuscular re-education;Patient/family education;Manual techniques;Manual lymph drainage;Iontophoresis 4mg /ml Dexamethasone;Passive range of motion;Dry needling;Taping    PT Next Visit Plan begin with aquatic rehab and land rehab. Consider ebgining with progressive exercises and use anti-inflamatory modalites such as ionto and ultraspund if needed. Therapy trialed taping today.    PT Home Exercise Plan Date: 12/15/2020  Prepared by: 12/17/2020    Exercises  Supine Bridge - 1 x daily - 7 x weekly - 3 sets - 10 reps  Supine Quad Set - 1 x daily - 7 x weekly - 3 sets - 10 reps  Supine Active Straight Leg Raise - 1 x daily - 7 x weekly - 3 sets - 10 reps    Consulted and Agree with Plan of Care Patient             Patient will benefit from skilled therapeutic intervention in order to improve the following deficits and impairments:  Decreased mobility, Difficulty walking, Pain, Decreased strength, Decreased activity tolerance  Visit Diagnosis: Acute pain of left knee  Other abnormalities of gait and mobility  Pain in right hip     Problem List Patient Active Problem List   Diagnosis Date Noted    Snoring 12/23/2019   Sleep related hypoxia 12/23/2019   Severe sleep apnea 12/23/2019   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   Follow-up examination after eye surgery 08/03/2019   Posterior capsular opacification non visually significant of right eye 08/03/2019   Right epiretinal membrane 08/03/2019   Pseudophakia of both eyes 08/03/2019   Left epiretinal membrane 08/03/2019   Hypothyroidism 08/28/2011   Menopausal symptoms 08/28/2011   Decreased libido 08/28/2011   Hypercholesterolemia 08/28/2011   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

## 2020-12-29 ENCOUNTER — Ambulatory Visit (HOSPITAL_BASED_OUTPATIENT_CLINIC_OR_DEPARTMENT_OTHER): Payer: Medicare PPO | Admitting: Physical Therapy

## 2020-12-29 ENCOUNTER — Other Ambulatory Visit: Payer: Self-pay

## 2020-12-29 ENCOUNTER — Encounter (HOSPITAL_BASED_OUTPATIENT_CLINIC_OR_DEPARTMENT_OTHER): Payer: Self-pay | Admitting: Physical Therapy

## 2020-12-29 DIAGNOSIS — M25562 Pain in left knee: Secondary | ICD-10-CM | POA: Diagnosis not present

## 2020-12-29 DIAGNOSIS — R2689 Other abnormalities of gait and mobility: Secondary | ICD-10-CM | POA: Diagnosis not present

## 2020-12-29 DIAGNOSIS — M25551 Pain in right hip: Secondary | ICD-10-CM

## 2020-12-30 ENCOUNTER — Encounter (HOSPITAL_BASED_OUTPATIENT_CLINIC_OR_DEPARTMENT_OTHER): Payer: Self-pay | Admitting: Physical Therapy

## 2020-12-30 NOTE — Therapy (Signed)
[facility] [address] [place], [place], [address] Phone: [phone]   Fax:  [phone]  Physical Therapy Treatment  Patient Details  Name: Erica Chase MRN: [id number] Date of Birth: 02/10/1946 Referring Provider (PT): Dr Cherlynn June   Encounter Date: 12/29/2020   PT End of Session - 12/29/20 2119     Visit Number 6    Number of Visits 15    Date for PT Re-Evaluation 01/30/21    Authorization Type [employer]    PT Start Time 0930    PT Stop Time 1011    PT Time Calculation (min) 41 min    Activity Tolerance Patient tolerated treatment well    Behavior During Therapy [facility] for tasks assessed/performed             Past Medical History:  Diagnosis Date   Abdominal pain 2004   ADD (attention deficit disorder) 08/2010   Allergic rhinitis 01/2011   Asthma    unsure-,reflux ruled out using nasonex and pulmocort   Atypical endocervical cells on Pap smear 1999   CIN III (cervical intraepithelial neoplasia grade III) with severe dysplasia 2001   FHx: cancer    HSV infection 1997   Hyperlipidemia 2018   Hypothyroidism    Libido, decreased 2011   Menopausal symptoms 2010   Orgasmic dysfunction 2006   Ovarian cyst    Pelvic relaxation 12/2000   PONV (postoperative nausea and vomiting)    scop patch has worked well in past   Sinusitis 01/2011   Stroke [facility])     Past Surgical History:  Procedure Laterality Date   CARPAL TUNNEL RELEASE     CATARACT EXTRACTION Left 2015   CHOLECYSTECTOMY  01/2011   KNEE ARTHROSCOPY     KNEE ARTHROSCOPY  03/05/2011   Procedure: ARTHROSCOPY KNEE;  Surgeon: Javier Docker;  Location: [facility];  Service: Orthopedics;  Laterality: Left;  left knee scope with debridement , Partial lateral meniscectomy   ROTATOR CUFF REPAIR     THUMB ARTHROSCOPY     thumb surgery      There were no vitals filed for this visit.   Subjective Assessment - 12/29/20 2113      Subjective Patient has her MRI. The MD reports unless there is a mechanical block, they will hold on surgery at this time. her patellar tendon is intact. Her meicus is torn. She has been going to the pool and the gym on a regular basis.    Pertinent History Stroke/TIA: aysymtomatic at this time 10-15 years ago; left knee pain    How long can you sit comfortably? Sit to stand    How long can you walk comfortably? ncreased pain on uneven surfaces    Diagnostic tests C-ray: negaitve per patient    Patient Stated Goals to have less pain    Currently in Pain? No/denies    Pain Location Hip    Pain Orientation Left    Pain Descriptors / Indicators Aching    Pain Type Chronic pain    Pain Onset 1 to 4 weeks ago    Pain Frequency Intermittent    Aggravating Factors  stting for too long    Pain Relieving Factors movement    Effect of Pain on Daily Activities difffiuclty going up and down steps    Multiple Pain Sites No    Pain Score 9    Pain Location Knee    Pain Orientation Left    Pain Descriptors /  Indicators Aching    Pain Type Chronic pain    Pain Onset 1 to 4 weeks ago    Pain Frequency Intermittent    Aggravating Factors  standing and walking    Pain Relieving Factors rest    Effect of Pain on Daily Activities difficulty perfroming ADL's                Pt seen for aquatic therapy today.  Treatment took place in water 3.25-4 ft in depth at the [facility] pool. Temp of water was 91.  Pt entered/exited the pool via stairs (step through pattern) independently with bilat rail.   Introduction to water. Had patient stand at different levels so she could feel the buoyancy    Warm up: heel/toe walking x4 laps across pool chest deep side stepping x4 laps from shallow to deep;    Ambulation with noodle support: long strides, march emphasized normal gait without compensation        Seated: laq in limited range left 2lb full range right    Seated leg press blue 2x10    board trunk flexion x10 lateral board rotation x10 each way seated    Steps step up x20 each leg; lateral step up x20 each leg;   better tolerance on the left   Church pews x20     Balance  Tandem stance  Box step x10  Single leg stance 2x20 sec hold bilateral  Tandem walk 2 laps  Narrow base eyes closed 3x30 sec hold  Hip 3 way 2 lb weight x20 each      Pt requires buoyancy for support and to offload joints with strengthening exercises. Viscosity of the water is needed for resistance of strengthening; water current perturbations provides challenge to standing balance unsupported, requiring increased core activation.               PT Education - 12/29/20 2119     Education Details reviewed HEP and symptom management    Person(s) Educated Patient    Methods Explanation;Demonstration;Tactile cues;Verbal cues    Comprehension Verbalized understanding;Returned demonstration;Verbal cues required;Tactile cues required              PT Short Term Goals - 12/19/20 1010       PT SHORT TERM GOAL #1   Title Patient will report a 50% reduction in tendereness to palpation of the patella    Time 3    Period Weeks    Status On-going    Target Date 01/05/21      PT SHORT TERM GOAL #2   Title Patient will demonstrate full pain free ROM of the left knee    Time 3    Period Weeks    Status On-going    Target Date 01/05/21      PT SHORT TERM GOAL #3   Title Patient will demonstrate 30 sec of left single leg stance without pain    Time 3    Period Weeks    Status On-going    Target Date 01/05/21      PT SHORT TERM GOAL #4   Title Patient will increase right hip flexion strength to 4+/5    Time 3    Period Weeks    Status New    Target Date 01/09/21               PT Long Term Goals - 12/19/20 1013       PT LONG TERM GOAL #1   Title  Patient will transfer sit to stand without pain    Baseline continues to have tightness in the knee and hip    Time 6     Period Weeks    Status On-going    Target Date 01/30/21      PT LONG TERM GOAL #2   Title Patient will go up/down steps without pain    Baseline has significant pain since her knee popped    Time 6    Period Weeks    Status On-going    Target Date 01/30/21      PT LONG TERM GOAL #3   Title Patient wll return to hiking without increased pain    Baseline unable to hike    Time 6    Period Weeks    Status On-going    Target Date 01/30/21                   Plan - 12/30/20 1235     Clinical Impression Statement Patient tolerated treatment well. Since she is likley to be non-surgical so we began to progress her exercises. Therapy added church pew for involuntary firing of the quad as well as step ups and lateral step ups. She reported no significant increase in pain in the knee. We also worked on single leg stability for the hip and the knee.    Personal Factors and Comorbidities Comorbidity 1    Comorbidities left knee scop >10 years    Examination-Activity Limitations Stand;Stairs;Squat;Sit    Examination-Participation Restrictions Cleaning;Yard Work;Shop    Stability/Clinical Decision Making Evolving/Moderate complexity    Clinical Decision Making Low    Rehab Potential Excellent    PT Frequency 2x / week    PT Duration 6 weeks    PT Treatment/Interventions ADLs/Self Care Home Management;Cryotherapy;Electrical Stimulation;Moist Heat;Traction;Ultrasound;DME Instruction;Gait training;Stair training;Functional mobility training;Therapeutic activities;Therapeutic exercise;Neuromuscular re-education;Patient/family education;Manual techniques;Manual lymph drainage;Iontophoresis 4mg /ml Dexamethasone;Passive range of motion;Dry needling;Taping    PT Next Visit Plan begin with aquatic rehab and land rehab. Consider ebgining with progressive exercises and use anti-inflamatory modalites such as ionto and ultraspund if needed. Therapy trialed taping today.    PT Home Exercise Plan Date:  12/15/2020  Prepared by: 12/17/2020    Exercises  Supine Bridge - 1 x daily - 7 x weekly - 3 sets - 10 reps  Supine Quad Set - 1 x daily - 7 x weekly - 3 sets - 10 reps  Supine Active Straight Leg Raise - 1 x daily - 7 x weekly - 3 sets - 10 reps    Consulted and Agree with Plan of Care Patient             Patient will benefit from skilled therapeutic intervention in order to improve the following deficits and impairments:  Decreased mobility, Difficulty walking, Pain, Decreased strength, Decreased activity tolerance  Visit Diagnosis: Acute pain of left knee  Other abnormalities of gait and mobility  Pain in right hip     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   Follow-up examination after eye surgery 08/03/2019   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 [place], [place], [address] Phone: [phone]   Fax:  [phone]  Name: HUYEN PERAZZO MRN: [id number] Date of Birth: 1945-11-25

## 2021-01-02 ENCOUNTER — Encounter (HOSPITAL_BASED_OUTPATIENT_CLINIC_OR_DEPARTMENT_OTHER): Payer: Self-pay | Admitting: Physical Therapy

## 2021-01-02 ENCOUNTER — Other Ambulatory Visit: Payer: Self-pay

## 2021-01-02 ENCOUNTER — Ambulatory Visit (HOSPITAL_BASED_OUTPATIENT_CLINIC_OR_DEPARTMENT_OTHER): Payer: Medicare PPO | Attending: Family Medicine | Admitting: Physical Therapy

## 2021-01-02 DIAGNOSIS — R2681 Unsteadiness on feet: Secondary | ICD-10-CM | POA: Insufficient documentation

## 2021-01-02 DIAGNOSIS — G8929 Other chronic pain: Secondary | ICD-10-CM | POA: Insufficient documentation

## 2021-01-02 DIAGNOSIS — M6281 Muscle weakness (generalized): Secondary | ICD-10-CM | POA: Insufficient documentation

## 2021-01-02 DIAGNOSIS — R2689 Other abnormalities of gait and mobility: Secondary | ICD-10-CM | POA: Diagnosis not present

## 2021-01-02 DIAGNOSIS — R262 Difficulty in walking, not elsewhere classified: Secondary | ICD-10-CM | POA: Insufficient documentation

## 2021-01-02 DIAGNOSIS — M25551 Pain in right hip: Secondary | ICD-10-CM | POA: Diagnosis not present

## 2021-01-02 DIAGNOSIS — M25561 Pain in right knee: Secondary | ICD-10-CM | POA: Diagnosis not present

## 2021-01-02 DIAGNOSIS — M25562 Pain in left knee: Secondary | ICD-10-CM | POA: Insufficient documentation

## 2021-01-02 DIAGNOSIS — R293 Abnormal posture: Secondary | ICD-10-CM | POA: Diagnosis not present

## 2021-01-02 NOTE — Therapy (Signed)
[facility] [address] [place], [place], [address] Phone: [phone]   Fax:  [phone]  Physical Therapy Treatment  Patient Details  Name: Erica Chase MRN: [id number] Date of Birth: 07/31/1945 Referring Provider (PT): Dr Cherlynn June   Encounter Date: 01/02/2021   PT End of Session - 01/02/21 0947     Visit Number 7    Number of Visits 15    Date for PT Re-Evaluation 01/30/21    PT Start Time 0815    PT Stop Time 0900    PT Time Calculation (min) 45 min    Equipment Utilized During Treatment Other (comment)   pool noodles and hand buoys   Activity Tolerance Patient tolerated treatment well    Behavior During Therapy [facility] for tasks assessed/performed             Past Medical History:  Diagnosis Date   Abdominal pain 2004   ADD (attention deficit disorder) 08/2010   Allergic rhinitis 01/2011   Asthma    unsure-,reflux ruled out using nasonex and pulmocort   Atypical endocervical cells on Pap smear 1999   CIN III (cervical intraepithelial neoplasia grade III) with severe dysplasia 2001   FHx: cancer    HSV infection 1997   Hyperlipidemia 2018   Hypothyroidism    Libido, decreased 2011   Menopausal symptoms 2010   Orgasmic dysfunction 2006   Ovarian cyst    Pelvic relaxation 12/2000   PONV (postoperative nausea and vomiting)    scop patch has worked well in past   Sinusitis 01/2011   Stroke [facility])     Past Surgical History:  Procedure Laterality Date   CARPAL TUNNEL RELEASE     CATARACT EXTRACTION Left 2015   CHOLECYSTECTOMY  01/2011   KNEE ARTHROSCOPY     KNEE ARTHROSCOPY  03/05/2011   Procedure: ARTHROSCOPY KNEE;  Surgeon: Javier Docker;  Location: [facility];  Service: Orthopedics;  Laterality: Left;  left knee scope with debridement , Partial lateral meniscectomy   ROTATOR CUFF REPAIR     THUMB ARTHROSCOPY     thumb surgery      There were no vitals filed for this  visit.   Subjective Assessment - 01/02/21 1100     Subjective Pt presents with copies of her MRI.  Reports some buringing in her right hip but overall minimal pain                                          PT Short Term Goals - 12/19/20 1010       PT SHORT TERM GOAL #1   Title Patient will report a 50% reduction in tendereness to palpation of the patella    Time 3    Period Weeks    Status On-going    Target Date 01/05/21      PT SHORT TERM GOAL #2   Title Patient will demonstrate full pain free ROM of the left knee    Time 3    Period Weeks    Status On-going    Target Date 01/05/21      PT SHORT TERM GOAL #3   Title Patient will demonstrate 30 sec of left single leg stance without pain    Time 3    Period Weeks    Status On-going    Target Date 01/05/21  PT SHORT TERM GOAL #4   Title Patient will increase right hip flexion strength to 4+/5    Time 3    Period Weeks    Status New    Target Date 01/09/21               PT Long Term Goals - 12/19/20 1013       PT LONG TERM GOAL #1   Title Patient will transfer sit to stand without pain    Baseline continues to have tightness in the knee and hip    Time 6    Period Weeks    Status On-going    Target Date 01/30/21      PT LONG TERM GOAL #2   Title Patient will go up/down steps without pain    Baseline has significant pain since her knee popped    Time 6    Period Weeks    Status On-going    Target Date 01/30/21      PT LONG TERM GOAL #3   Title Patient wll return to hiking without increased pain    Baseline unable to hike    Time 6    Period Weeks    Status On-going    Target Date 01/30/21            Pt seen for aquatic therapy today.  Treatment took place in water 3.25-4 ft in depth at the [facility] pool. Temp of water was 91.  Pt entered/exited the pool via stairs (step through pattern) independently with bilat rail.    Warm up: heel/toe  walking x4 laps across pool chest deep side stepping x4 laps from shallow to deep;    Ambulation without noodle support backward stepping cues for long stride to engage glute.  Pt amb backward 2 widths between exercises fore recovery  Seated Stretching gastroc, hamstring and adductors 3 x 20-25 second hold  Standing Core and le strengthening/ balance -Quad and hip extension stretch facing wall using noodle each 3 reps of 20-25 second hold -Hip flex strengthening pulling noodle towards wall x 10 R/L -hip ext 2 x 10 reps R/L With ankle buoys  -hip flex 2 x 10 -hip ext 2x10 -Flex/ext 2 x 10 Cues for avoiding  end range in right hip extension which causes burning, for increased speed for increased resistance and core contractions of abdominals and opposite glute Squats bottom step x 12 reps then with rle only x 10 Backward step ups submerged to chest on water step x 12 R/L   -step ups up bottom step lle only and 10  Standing leg press: noodle kick down, included core engagement 3 x 10 reps hip forward and in external rotation. UE support bilat with hand buoys decreased to unilateral      Patient will benefit from skilled therapeutic intervention in order to improve the following deficits and impairments:  Decreased mobility, Difficulty walking, Pain, Decreased strength, Decreased activity tolerance  Visit Diagnosis: Acute pain of left knee  Other abnormalities of gait and mobility  Pain in right hip  Clinical Assessment Pt able to complete prescribed exercises today with no adverse effects. She does report burning with end range hip exention left in which she is in instructed to avoid working in that position. Focus today is on glute and quad strength with addition of core stabilization. Hand wrote addition exercises on pt lamenated program.   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 [place], [place], [address] Phone: [phone]   Fax:  [phone]  Name: Erica Chase MRN: [id number] Date of Birth: Jun 20, 1945

## 2021-01-03 ENCOUNTER — Ambulatory Visit (HOSPITAL_BASED_OUTPATIENT_CLINIC_OR_DEPARTMENT_OTHER): Payer: Medicare PPO | Admitting: Physical Therapy

## 2021-01-05 ENCOUNTER — Ambulatory Visit (HOSPITAL_BASED_OUTPATIENT_CLINIC_OR_DEPARTMENT_OTHER): Payer: Medicare PPO | Admitting: Physical Therapy

## 2021-01-05 ENCOUNTER — Other Ambulatory Visit: Payer: Self-pay

## 2021-01-05 ENCOUNTER — Encounter (HOSPITAL_BASED_OUTPATIENT_CLINIC_OR_DEPARTMENT_OTHER): Payer: Self-pay | Admitting: Physical Therapy

## 2021-01-05 DIAGNOSIS — M25562 Pain in left knee: Secondary | ICD-10-CM

## 2021-01-05 DIAGNOSIS — R2689 Other abnormalities of gait and mobility: Secondary | ICD-10-CM

## 2021-01-05 DIAGNOSIS — M25551 Pain in right hip: Secondary | ICD-10-CM | POA: Diagnosis not present

## 2021-01-05 DIAGNOSIS — M25561 Pain in right knee: Secondary | ICD-10-CM | POA: Diagnosis not present

## 2021-01-05 DIAGNOSIS — G8929 Other chronic pain: Secondary | ICD-10-CM | POA: Diagnosis not present

## 2021-01-05 DIAGNOSIS — R262 Difficulty in walking, not elsewhere classified: Secondary | ICD-10-CM | POA: Diagnosis not present

## 2021-01-05 DIAGNOSIS — M6281 Muscle weakness (generalized): Secondary | ICD-10-CM | POA: Diagnosis not present

## 2021-01-05 DIAGNOSIS — R293 Abnormal posture: Secondary | ICD-10-CM | POA: Diagnosis not present

## 2021-01-05 DIAGNOSIS — R2681 Unsteadiness on feet: Secondary | ICD-10-CM | POA: Diagnosis not present

## 2021-01-05 NOTE — Therapy (Signed)
[facility] [address] [place], [place], [address] Phone: [phone]   Fax:  [phone]  Physical Therapy Treatment  Patient Details  Name: Erica Chase MRN: [id number] Date of Birth: [birth year] Referring Provider (PT): Dr Cherlynn June   Encounter Date: 01/05/2021   PT End of Session - 01/05/21 0929     Visit Number 8    Number of Visits 15    Authorization Type [employer]    PT Start Time 0930    PT Stop Time 1012    PT Time Calculation (min) 42 min    Activity Tolerance Patient tolerated treatment well    Behavior During Therapy [facility] for tasks assessed/performed             Past Medical History:  Diagnosis Date   Abdominal pain 2004   ADD (attention deficit disorder) 08/2010   Allergic rhinitis 01/2011   Asthma    unsure-,reflux ruled out using nasonex and pulmocort   Atypical endocervical cells on Pap smear 1999   CIN III (cervical intraepithelial neoplasia grade III) with severe dysplasia 2001   FHx: cancer    HSV infection 1997   Hyperlipidemia 2018   Hypothyroidism    Libido, decreased 2011   Menopausal symptoms 2010   Orgasmic dysfunction 2006   Ovarian cyst    Pelvic relaxation 12/2000   PONV (postoperative nausea and vomiting)    scop patch has worked well in past   Sinusitis 01/2011   Stroke [facility])     Past Surgical History:  Procedure Laterality Date   CARPAL TUNNEL RELEASE     CATARACT EXTRACTION Left 2015   CHOLECYSTECTOMY  01/2011   KNEE ARTHROSCOPY     KNEE ARTHROSCOPY  03/05/2011   Procedure: ARTHROSCOPY KNEE;  Surgeon: Javier Docker;  Location: [facility];  Service: Orthopedics;  Laterality: Left;  left knee scope with debridement , Partial lateral meniscectomy   ROTATOR CUFF REPAIR     THUMB ARTHROSCOPY     thumb surgery      There were no vitals filed for this visit.   Subjective Assessment - 01/05/21 1436     Subjective Patient reports her knee and hip  have improved significantly. She is still having difficulty with steps.    Pertinent History Stroke/TIA: aysymtomatic at this time 10-15 years ago; left knee pain    How long can you sit comfortably? Sit to stand    How long can you walk comfortably? ncreased pain on uneven surfaces    Diagnostic tests C-ray: negaitve per patient    Patient Stated Goals to have less pain    Currently in Pain? Yes    Pain Score --   no specific number given but improved pain                  Pt seen for aquatic therapy today.  Treatment took place in water 3.25-4 ft in depth at the [facility] pool. Temp of water was 91.  Pt entered/exited the pool via stairs (step through pattern) independently with bilat rail.          Seated: laq in limited range left 2lb full range right    Seated leg press blue 2x10   board trunk flexion x10 lateral board rotation x10 each way seated    Steps step up x20 each leg; lateral step up x20 each leg;    Step downs 2x10 each leg    Lunges and side lunges  2 laps each way      Hip 3 way 2 lb weight x20 each       Pt requires buoyancy for support and to offload joints with strengthening exercises. Viscosity of the water is needed for resistance of strengthening; water current perturbations provides challenge to standing balance unsupported, requiring increased core activation.                     PT Education - 01/05/21 1437     Education Details reviewed HEP and symptom mangement    Person(s) Educated Patient    Methods Explanation;Tactile cues;Demonstration;Verbal cues    Comprehension Verbalized understanding;Returned demonstration;Verbal cues required;Tactile cues required              PT Short Term Goals - 12/19/20 1010       PT SHORT TERM GOAL #1   Title Patient will report a 50% reduction in tendereness to palpation of the patella    Time 3    Period Weeks    Status On-going    Target Date 01/05/21       PT SHORT TERM GOAL #2   Title Patient will demonstrate full pain free ROM of the left knee    Time 3    Period Weeks    Status On-going    Target Date 01/05/21      PT SHORT TERM GOAL #3   Title Patient will demonstrate 30 sec of left single leg stance without pain    Time 3    Period Weeks    Status On-going    Target Date 01/05/21      PT SHORT TERM GOAL #4   Title Patient will increase right hip flexion strength to 4+/5    Time 3    Period Weeks    Status New    Target Date 01/09/21               PT Long Term Goals - 12/19/20 1013       PT LONG TERM GOAL #1   Title Patient will transfer sit to stand without pain    Baseline continues to have tightness in the knee and hip    Time 6    Period Weeks    Status On-going    Target Date 01/30/21      PT LONG TERM GOAL #2   Title Patient will go up/down steps without pain    Baseline has significant pain since her knee popped    Time 6    Period Weeks    Status On-going    Target Date 01/30/21      PT LONG TERM GOAL #3   Title Patient wll return to hiking without increased pain    Baseline unable to hike    Time 6    Period Weeks    Status On-going    Target Date 01/30/21                   Plan - 01/05/21 1437     Clinical Impression Statement Patient tolerated treatment well. Therapy added in lumges and continued to work with her on steps in the pool. She had no significant increase in pain. She used weights for hip exercsies. Therapy will continue to progress as tolerated.    Personal Factors and Comorbidities Comorbidity 1    Comorbidities left knee scop >10 years    Examination-Activity Limitations Stand;Stairs;Squat;Sit    Examination-Participation Restrictions Cleaning;Yard Work;Shop  Stability/Clinical Decision Making Evolving/Moderate complexity    Clinical Decision Making Low    Rehab Potential Excellent    PT Frequency 2x / week    PT Duration 6 weeks    PT Treatment/Interventions  ADLs/Self Care Home Management;Cryotherapy;Electrical Stimulation;Moist Heat;Traction;Ultrasound;DME Instruction;Gait training;Stair training;Functional mobility training;Therapeutic activities;Therapeutic exercise;Neuromuscular re-education;Patient/family education;Manual techniques;Manual lymph drainage;Iontophoresis 4mg /ml Dexamethasone;Passive range of motion;Dry needling;Taping    PT Next Visit Plan advance glute and quad strengthening as tolerated.    PT Home Exercise Plan Date: 12/15/2020  Prepared by: 12/17/2020    Exercises  Supine Bridge - 1 x daily - 7 x weekly - 3 sets - 10 reps  Supine Quad Set - 1 x daily - 7 x weekly - 3 sets - 10 reps  Supine Active Straight Leg Raise - 1 x daily - 7 x weekly - 3 sets - 10 reps    Consulted and Agree with Plan of Care Patient             Patient will benefit from skilled therapeutic intervention in order to improve the following deficits and impairments:  Decreased mobility, Difficulty walking, Pain, Decreased strength, Decreased activity tolerance  Visit Diagnosis: Acute pain of left knee  Other abnormalities of gait and mobility  Pain in right hip     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   Follow-up examination after eye surgery 08/03/2019   Posterior capsular opacification non visually significant of right eye 08/03/2019   Right epiretinal membrane 08/03/2019   Pseudophakia of both eyes 08/03/2019   Left epiretinal membrane 08/03/2019   Hypothyroidism 08/28/2011   Menopausal symptoms 08/28/2011   Decreased libido 08/28/2011   Hypercholesterolemia 08/28/2011   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 [place], [place], [place] Phone: [phone]   Fax:  [phone]  Name: LETZY GULLICKSON MRN: [name] Date of Birth: 05-13-1945

## 2021-01-06 DIAGNOSIS — Z1231 Encounter for screening mammogram for malignant neoplasm of breast: Secondary | ICD-10-CM | POA: Diagnosis not present

## 2021-01-09 ENCOUNTER — Encounter (INDEPENDENT_AMBULATORY_CARE_PROVIDER_SITE_OTHER): Payer: Medicare PPO | Admitting: Ophthalmology

## 2021-01-09 ENCOUNTER — Other Ambulatory Visit: Payer: Self-pay

## 2021-01-09 ENCOUNTER — Ambulatory Visit (HOSPITAL_BASED_OUTPATIENT_CLINIC_OR_DEPARTMENT_OTHER): Payer: Medicare PPO | Admitting: Physical Therapy

## 2021-01-09 ENCOUNTER — Encounter (INDEPENDENT_AMBULATORY_CARE_PROVIDER_SITE_OTHER): Payer: Self-pay

## 2021-01-09 DIAGNOSIS — R2689 Other abnormalities of gait and mobility: Secondary | ICD-10-CM | POA: Diagnosis not present

## 2021-01-09 DIAGNOSIS — M25562 Pain in left knee: Secondary | ICD-10-CM

## 2021-01-09 DIAGNOSIS — R293 Abnormal posture: Secondary | ICD-10-CM | POA: Diagnosis not present

## 2021-01-09 DIAGNOSIS — M25551 Pain in right hip: Secondary | ICD-10-CM | POA: Diagnosis not present

## 2021-01-09 DIAGNOSIS — G8929 Other chronic pain: Secondary | ICD-10-CM | POA: Diagnosis not present

## 2021-01-09 DIAGNOSIS — M6281 Muscle weakness (generalized): Secondary | ICD-10-CM | POA: Diagnosis not present

## 2021-01-09 DIAGNOSIS — R2681 Unsteadiness on feet: Secondary | ICD-10-CM | POA: Diagnosis not present

## 2021-01-09 DIAGNOSIS — R262 Difficulty in walking, not elsewhere classified: Secondary | ICD-10-CM | POA: Diagnosis not present

## 2021-01-09 DIAGNOSIS — M25561 Pain in right knee: Secondary | ICD-10-CM | POA: Diagnosis not present

## 2021-01-09 NOTE — Therapy (Signed)
[facility] [address] [place], [place], [address] Phone: [phone]   Fax:  [phone]  Physical Therapy Treatment  Patient Details  Name: Erica Chase MRN: [id number] Date of Birth: March 05, 1946 Referring Provider (PT): Dr Cherlynn June   Encounter Date: 01/09/2021   PT End of Session - 01/09/21 0905     Visit Number 9    Number of Visits 15    Authorization Type [employer]    PT Start Time 0903    PT Stop Time 0945    PT Time Calculation (min) 42 min    Activity Tolerance Patient tolerated treatment well    Behavior During Therapy [facility] for tasks assessed/performed             Past Medical History:  Diagnosis Date   Abdominal pain 2004   ADD (attention deficit disorder) 08/2010   Allergic rhinitis 01/2011   Asthma    unsure-,reflux ruled out using nasonex and pulmocort   Atypical endocervical cells on Pap smear 1999   CIN III (cervical intraepithelial neoplasia grade III) with severe dysplasia 2001   FHx: cancer    HSV infection 1997   Hyperlipidemia 2018   Hypothyroidism    Libido, decreased 2011   Menopausal symptoms 2010   Orgasmic dysfunction 2006   Ovarian cyst    Pelvic relaxation 12/2000   PONV (postoperative nausea and vomiting)    scop patch has worked well in past   Sinusitis 01/2011   Stroke [facility])     Past Surgical History:  Procedure Laterality Date   CARPAL TUNNEL RELEASE     CATARACT EXTRACTION Left 2015   CHOLECYSTECTOMY  01/2011   KNEE ARTHROSCOPY     KNEE ARTHROSCOPY  03/05/2011   Procedure: ARTHROSCOPY KNEE;  Surgeon: Javier Docker;  Location: [facility];  Service: Orthopedics;  Laterality: Left;  left knee scope with debridement , Partial lateral meniscectomy   ROTATOR CUFF REPAIR     THUMB ARTHROSCOPY     thumb surgery      There were no vitals filed for this visit.   Subjective Assessment - 01/10/21 0728     Subjective Pt feels like session last visit  was helpful, feels better other than having to do yard work over w/e increasing some of her discomfort    Currently in Pain? Yes    Pain Score 3     Pain Descriptors / Indicators Burning;Aching    Pain Type Chronic pain    Multiple Pain Sites No                                          PT Short Term Goals - 12/19/20 1010       PT SHORT TERM GOAL #1   Title Patient will report a 50% reduction in tendereness to palpation of the patella    Time 3    Period Weeks    Status On-going    Target Date 01/05/21      PT SHORT TERM GOAL #2   Title Patient will demonstrate full pain free ROM of the left knee    Time 3    Period Weeks    Status On-going    Target Date 01/05/21      PT SHORT TERM GOAL #3   Title Patient will demonstrate 30 sec of left single leg stance without pain  Time 3    Period Weeks    Status On-going    Target Date 01/05/21      PT SHORT TERM GOAL #4   Title Patient will increase right hip flexion strength to 4+/5    Time 3    Period Weeks    Status New    Target Date 01/09/21               PT Long Term Goals - 12/19/20 1013       PT LONG TERM GOAL #1   Title Patient will transfer sit to stand without pain    Baseline continues to have tightness in the knee and hip    Time 6    Period Weeks    Status On-going    Target Date 01/30/21      PT LONG TERM GOAL #2   Title Patient will go up/down steps without pain    Baseline has significant pain since her knee popped    Time 6    Period Weeks    Status On-going    Target Date 01/30/21      PT LONG TERM GOAL #3   Title Patient wll return to hiking without increased pain    Baseline unable to hike    Time 6    Period Weeks    Status On-going    Target Date 01/30/21            Pt seen for aquatic therapy today.  Treatment took place in water 3.25-4 ft in depth at the [facility] pool. Temp of water was 91.  Pt entered/exited the pool via stairs  (step through pattern) independently with bilat rail.     Warm up: heel/toe walking x4 laps across pool chest deep side stepping x4 laps from shallow to deep;    Ambulation without noodle support backward stepping cues for long stride to engage glute.   Pt amb backward 2 widths between exercises for recovery   Seated Stretching gastroc, hamstring and adductors 3 x 20-25 second hold   Standing Core and le strengthening/ balance -Quad and hip extension stretch using water bench Using buoy: high knee marching x 15 Using band for resistance  -hip ext x10, add,, abd and hip flex each Squats bottom step x 12 reps then with rle only x 10 Forward and Backward step ups added side stepping R/L submerged 50% on water step x 12 R/L  (decreased submersion from last session for increased resistance)  Standing leg press: noodle kick down, with ankle buoy included core engagement 3 x 10 reps hip forward and in external rotation. UE support bilat with hand buoys decreased to unilateral          Plan - 01/10/21 0740     Clinical Impression Statement Pt with her own lamenated notes from last session.  New exercises added and instruction on variations of exercises we have completed/different ways to focus on various muscles as pt is knowledgable and in tune with her dysfunctions/management of her condition.  Increased focus on glute strength with concern for not increases right hip pain. Able to initiate using band for resistance with vc given for toleration.  SHe had no c/o discomfort or burning during this exercise.    Stability/Clinical Decision Making Evolving/Moderate complexity    Clinical Decision Making Low    Rehab Potential Excellent    PT Frequency 2x / week    PT Duration 6 weeks    PT Treatment/Interventions ADLs/Self  Care Home Management;Cryotherapy;Electrical Stimulation;Moist Heat;Traction;Ultrasound;DME Instruction;Gait training;Stair training;Functional mobility  training;Therapeutic activities;Therapeutic exercise;Neuromuscular re-education;Patient/family education;Manual techniques;Manual lymph drainage;Iontophoresis 4mg /ml Dexamethasone;Passive range of motion;Dry needling;Taping    PT Next Visit Plan advance glute and quad strengthening as tolerated.    PT Home Exercise Plan Date: 12/15/2020  Prepared by: 12/17/2020    Exercises  Supine Bridge - 1 x daily - 7 x weekly - 3 sets - 10 reps  Supine Quad Set - 1 x daily - 7 x weekly - 3 sets - 10 reps  Supine Active Straight Leg Raise - 1 x daily - 7 x weekly - 3 sets - 10 reps             Patient will benefit from skilled therapeutic intervention in order to improve the following deficits and impairments:  Decreased mobility, Difficulty walking, Pain, Decreased strength, Decreased activity tolerance  Visit Diagnosis: Acute pain of left knee  Other abnormalities of gait and mobility  Pain in right hip     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   Follow-up examination after eye surgery 08/03/2019   Posterior capsular opacification non visually significant of right eye 08/03/2019   Right epiretinal membrane 08/03/2019   Pseudophakia of both eyes 08/03/2019   Left epiretinal membrane 08/03/2019   Hypothyroidism 08/28/2011   Menopausal symptoms 08/28/2011   Decreased libido 08/28/2011   Hypercholesterolemia 08/28/2011   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

## 2021-01-10 ENCOUNTER — Encounter (HOSPITAL_BASED_OUTPATIENT_CLINIC_OR_DEPARTMENT_OTHER): Payer: Self-pay | Admitting: Physical Therapy

## 2021-01-10 DIAGNOSIS — E785 Hyperlipidemia, unspecified: Secondary | ICD-10-CM | POA: Diagnosis not present

## 2021-01-10 DIAGNOSIS — E039 Hypothyroidism, unspecified: Secondary | ICD-10-CM | POA: Diagnosis not present

## 2021-01-10 DIAGNOSIS — Z0000 Encounter for general adult medical examination without abnormal findings: Secondary | ICD-10-CM | POA: Diagnosis not present

## 2021-01-12 ENCOUNTER — Ambulatory Visit (HOSPITAL_BASED_OUTPATIENT_CLINIC_OR_DEPARTMENT_OTHER): Payer: Medicare PPO | Admitting: Physical Therapy

## 2021-01-12 ENCOUNTER — Other Ambulatory Visit: Payer: Self-pay

## 2021-01-12 ENCOUNTER — Encounter (HOSPITAL_BASED_OUTPATIENT_CLINIC_OR_DEPARTMENT_OTHER): Payer: Self-pay | Admitting: Physical Therapy

## 2021-01-12 DIAGNOSIS — M25562 Pain in left knee: Secondary | ICD-10-CM | POA: Diagnosis not present

## 2021-01-12 DIAGNOSIS — R2681 Unsteadiness on feet: Secondary | ICD-10-CM

## 2021-01-12 DIAGNOSIS — M6281 Muscle weakness (generalized): Secondary | ICD-10-CM | POA: Diagnosis not present

## 2021-01-12 DIAGNOSIS — R2689 Other abnormalities of gait and mobility: Secondary | ICD-10-CM | POA: Diagnosis not present

## 2021-01-12 DIAGNOSIS — R262 Difficulty in walking, not elsewhere classified: Secondary | ICD-10-CM

## 2021-01-12 DIAGNOSIS — R293 Abnormal posture: Secondary | ICD-10-CM

## 2021-01-12 DIAGNOSIS — G8929 Other chronic pain: Secondary | ICD-10-CM

## 2021-01-12 DIAGNOSIS — M25561 Pain in right knee: Secondary | ICD-10-CM | POA: Diagnosis not present

## 2021-01-12 DIAGNOSIS — M25551 Pain in right hip: Secondary | ICD-10-CM | POA: Diagnosis not present

## 2021-01-12 NOTE — Therapy (Addendum)
[place] [address] [place], [place], [address] Phone: [phone]   Fax:  [phone]  Physical Therapy Treatment  Patient Details  Name: Erica Chase MRN: [id number] Date of Birth: Oct 04, 1945 Referring Provider (PT): Dr Erica Chase   Encounter Date: 01/12/2021     Past Medical History:  Diagnosis Date   Abdominal pain 2004   ADD (attention deficit disorder) 08/2010   Allergic rhinitis 01/2011   Asthma    unsure-,reflux ruled out using nasonex and pulmocort   Atypical endocervical cells on Pap smear 1999   CIN III (cervical intraepithelial neoplasia grade III) with severe dysplasia 2001   FHx: cancer    HSV infection 1997   Hyperlipidemia 2018   Hypothyroidism    Libido, decreased 2011   Menopausal symptoms 2010   Orgasmic dysfunction 2006   Ovarian cyst    Pelvic relaxation 12/2000   PONV (postoperative nausea and vomiting)    scop patch has worked well in past   Sinusitis 01/2011   Stroke [facility])     Past Surgical History:  Procedure Laterality Date   CARPAL TUNNEL RELEASE     CATARACT EXTRACTION Left 2015   CHOLECYSTECTOMY  01/2011   KNEE ARTHROSCOPY     KNEE ARTHROSCOPY  03/05/2011   Procedure: ARTHROSCOPY KNEE;  Surgeon: Erica Chase;  Location: [employer] [facility];  Service: Orthopedics;  Laterality: Left;  left knee scope with debridement , Partial lateral meniscectomy   ROTATOR CUFF REPAIR     THUMB ARTHROSCOPY     thumb surgery      There were no vitals filed for this visit.                                   PT Short Term Goals - 01/20/21 1322       PT SHORT TERM GOAL #1   Title Patient will report a 50% reduction in tendereness to palpation of the patella    Baseline less TTP    Time 3    Period Weeks    Status Achieved    Target Date 01/05/21      PT SHORT TERM GOAL #2   Title Patient will demonstrate full pain free ROM of the left  knee    Baseline continues to have pain but can get to deep end range on the left    Time 3    Period Weeks    Status Partially Met      PT SHORT TERM GOAL #3   Title Patient will demonstrate 30 sec of left single leg stance without pain    Baseline some instability on land but can hold for the time    Time 3    Period Weeks    Status On-going    Target Date 01/05/21      PT SHORT TERM GOAL #4   Title Patient will increase right hip flexion strength to 4+/5    Baseline still some strength limitations with left hip flexion    Time 3    Period Weeks    Status Partially Met    Target Date 03/03/21               PT Long Term Goals - 01/20/21 1325       PT LONG TERM GOAL #1   Title Patient will transfer sit to stand without pain    Baseline improved  but can still hace pain    Time 6    Period Weeks    Status On-going      PT LONG TERM GOAL #2   Title Patient will go up/down steps without pain    Baseline pain going down. Improving going up    Time 6    Period Weeks    Status On-going      PT LONG TERM GOAL #3   Title Patient wll return to hiking without increased pain    Baseline unable to hike    Time 6    Period Weeks    Status On-going            Pt seen for aquatic therapy today.  Treatment took place in water 3.25-4 ft in depth at the [facility] pool. Temp of water was 91.  Pt entered/exited the pool via stairs (step through pattern) independently with bilat rail.     Warm up: heel/toe walking x4 laps across pool chest deep side stepping x4 laps from shallow to deep;    Ambulation without noodle support backward stepping cues for long stride to engage glute.   Pt amb backward 2 widths between exercises for recovery   Seated Stretching gastroc, hamstring and adductors 3 x 20-25 second hold   Standing Backward lunges x 10 R/L Core and le strengthening/ balance 2 foam hand buoys  -vertical knees to chest 2 x 10 -supine knees to chest  2 x10 -vertical to sup x 10 -vertical to prone x 10 -pendulum prone to supine x 10 - pendulum lateral -Quad and hip extension stretch using water bench Using buoy: high knee marching x 15 SLS trials R/L     Standing leg press: noodle kick down, with ankle buoy included core engagement 3 x 10 reps hip forward and in external rotation. UE support bilat with hand buoys decreased to unilateral          Patient will benefit from skilled therapeutic intervention in order to improve the following deficits and impairments:  Decreased mobility, Difficulty walking, Pain, Decreased strength, Decreased activity tolerance  Visit Diagnosis: Abnormal posture  Chronic pain of right knee  Difficulty in walking, not elsewhere classified  Muscle weakness (generalized)  Unsteadiness on feet  Other abnormalities of gait and mobility     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   Follow-up examination after eye surgery 08/03/2019   Posterior capsular opacification non visually significant of right eye 08/03/2019   Right epiretinal membrane 08/03/2019   Pseudophakia of both eyes 08/03/2019   Left epiretinal membrane 08/03/2019   Hypothyroidism 08/28/2011   Menopausal symptoms 08/28/2011   Decreased libido 08/28/2011   Hypercholesterolemia 08/28/2011   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    Clinical Assessment Pt with continued discomfort from weekend. Focus today on core strength/hip.  Pt demonstrates good abdominal strength > posterior core.  Core rotattion using hand buoys with some difficulty. Pt improving with balance as  demonstrated by ability to complete SLS bilaterally x 30 seconds submerged. She has full left knee ROM unloaded pain free and is climbing stairs with improvement , less pain.  Pt to  see Dr Erica Chase next week.  Discussed progression of therapy and maximal potential.  Pt with 2 more aquatic visits scheduled.  SHe is compliant with HEP coming to pool 5 days weekly.  Addended Erica Chase Tharon Aquas) Rollins Wrightson MPT  01/23/2021, 1:24 PM  [facility] [address] [place], [place], [address] Phone: [phone]   Fax:  [phone]  Name: Erica Chase MRN: [id number] Date of Birth: 1945-12-23

## 2021-01-13 ENCOUNTER — Ambulatory Visit (HOSPITAL_BASED_OUTPATIENT_CLINIC_OR_DEPARTMENT_OTHER): Payer: Medicare PPO | Admitting: Physical Therapy

## 2021-01-16 DIAGNOSIS — M25562 Pain in left knee: Secondary | ICD-10-CM | POA: Diagnosis not present

## 2021-01-17 DIAGNOSIS — Z1212 Encounter for screening for malignant neoplasm of rectum: Secondary | ICD-10-CM | POA: Diagnosis not present

## 2021-01-17 DIAGNOSIS — R011 Cardiac murmur, unspecified: Secondary | ICD-10-CM | POA: Diagnosis not present

## 2021-01-17 DIAGNOSIS — I700 Atherosclerosis of aorta: Secondary | ICD-10-CM | POA: Diagnosis not present

## 2021-01-17 DIAGNOSIS — G459 Transient cerebral ischemic attack, unspecified: Secondary | ICD-10-CM | POA: Diagnosis not present

## 2021-01-17 DIAGNOSIS — E039 Hypothyroidism, unspecified: Secondary | ICD-10-CM | POA: Diagnosis not present

## 2021-01-17 DIAGNOSIS — M25571 Pain in right ankle and joints of right foot: Secondary | ICD-10-CM | POA: Diagnosis not present

## 2021-01-17 DIAGNOSIS — E785 Hyperlipidemia, unspecified: Secondary | ICD-10-CM | POA: Diagnosis not present

## 2021-01-17 DIAGNOSIS — R7989 Other specified abnormal findings of blood chemistry: Secondary | ICD-10-CM | POA: Diagnosis not present

## 2021-01-17 DIAGNOSIS — I6389 Other cerebral infarction: Secondary | ICD-10-CM | POA: Diagnosis not present

## 2021-01-17 DIAGNOSIS — Z0000 Encounter for general adult medical examination without abnormal findings: Secondary | ICD-10-CM | POA: Diagnosis not present

## 2021-01-18 ENCOUNTER — Ambulatory Visit (HOSPITAL_BASED_OUTPATIENT_CLINIC_OR_DEPARTMENT_OTHER): Payer: Medicare PPO | Admitting: Physical Therapy

## 2021-01-18 ENCOUNTER — Other Ambulatory Visit: Payer: Self-pay

## 2021-01-18 DIAGNOSIS — M25561 Pain in right knee: Secondary | ICD-10-CM | POA: Diagnosis not present

## 2021-01-18 DIAGNOSIS — R2689 Other abnormalities of gait and mobility: Secondary | ICD-10-CM | POA: Diagnosis not present

## 2021-01-18 DIAGNOSIS — R293 Abnormal posture: Secondary | ICD-10-CM | POA: Diagnosis not present

## 2021-01-18 DIAGNOSIS — R2681 Unsteadiness on feet: Secondary | ICD-10-CM | POA: Diagnosis not present

## 2021-01-18 DIAGNOSIS — R262 Difficulty in walking, not elsewhere classified: Secondary | ICD-10-CM

## 2021-01-18 DIAGNOSIS — M25562 Pain in left knee: Secondary | ICD-10-CM | POA: Diagnosis not present

## 2021-01-18 DIAGNOSIS — M6281 Muscle weakness (generalized): Secondary | ICD-10-CM | POA: Diagnosis not present

## 2021-01-18 DIAGNOSIS — G8929 Other chronic pain: Secondary | ICD-10-CM | POA: Diagnosis not present

## 2021-01-18 DIAGNOSIS — M25551 Pain in right hip: Secondary | ICD-10-CM | POA: Diagnosis not present

## 2021-01-18 NOTE — Therapy (Signed)
[facility] [address] [place], [place], [address] Phone: [phone]   Fax:  [phone]  Physical Therapy Treatment  Patient Details  Name: Erica Chase MRN: [id number] Date of Birth: Sep 05, 1945 Referring Provider (PT): Dr Cherlynn June   Encounter Date: 01/18/2021   PT End of Session - 01/18/21 0820     Visit Number 11    Number of Visits 15    Date for PT Re-Evaluation 01/30/21    Authorization Type [employer]    PT Start Time 0820    PT Stop Time 0905    PT Time Calculation (min) 45 min    Equipment Utilized During Treatment Other (comment)    Activity Tolerance Patient tolerated treatment well    Behavior During Therapy [facility] for tasks assessed/performed             Past Medical History:  Diagnosis Date   Abdominal pain 2004   ADD (attention deficit disorder) 08/2010   Allergic rhinitis 01/2011   Asthma    unsure-,reflux ruled out using nasonex and pulmocort   Atypical endocervical cells on Pap smear 1999   CIN III (cervical intraepithelial neoplasia grade III) with severe dysplasia 2001   FHx: cancer    HSV infection 1997   Hyperlipidemia 2018   Hypothyroidism    Libido, decreased 2011   Menopausal symptoms 2010   Orgasmic dysfunction 2006   Ovarian cyst    Pelvic relaxation 12/2000   PONV (postoperative nausea and vomiting)    scop patch has worked well in past   Sinusitis 01/2011   Stroke [facility])     Past Surgical History:  Procedure Laterality Date   CARPAL TUNNEL RELEASE     CATARACT EXTRACTION Left 2015   CHOLECYSTECTOMY  01/2011   KNEE ARTHROSCOPY     KNEE ARTHROSCOPY  03/05/2011   Procedure: ARTHROSCOPY KNEE;  Surgeon: Javier Docker;  Location: [facility];  Service: Orthopedics;  Laterality: Left;  left knee scope with debridement , Partial lateral meniscectomy   ROTATOR CUFF REPAIR     THUMB ARTHROSCOPY     thumb surgery      There were no vitals filed for this  visit.   Subjective Assessment - 01/18/21 1341     Subjective Saw Dr Shelle Iron.  No cortizone shot.    Currently in Pain? Yes    Pain Score 2     Pain Orientation Left    Pain Descriptors / Indicators Aching;Burning    Pain Type Chronic pain    Pain Onset 1 to 4 weeks ago    Pain Frequency Intermittent    Pain Score 2    Pain Orientation Left    Pain Descriptors / Indicators Aching    Pain Type Chronic pain                                          PT Short Term Goals - 01/18/21 1339       PT SHORT TERM GOAL #2   Baseline 01/18/21  Full range wihtout pain unloaded.    Status On-going      PT SHORT TERM GOAL #3   Baseline 01/18/21 Completes submerged to waist    Status On-going               PT Long Term Goals - 12/19/20 1013  PT LONG TERM GOAL #1   Title Patient will transfer sit to stand without pain    Baseline continues to have tightness in the knee and hip    Time 6    Period Weeks    Status On-going    Target Date 01/30/21      PT LONG TERM GOAL #2   Title Patient will go up/down steps without pain    Baseline has significant pain since her knee popped    Time 6    Period Weeks    Status On-going    Target Date 01/30/21      PT LONG TERM GOAL #3   Title Patient wll return to hiking without increased pain    Baseline unable to hike    Time 6    Period Weeks    Status On-going    Target Date 01/30/21           Pt seen for aquatic therapy today.  Treatment took place in water 3.25-4 ft in depth at the [facility] pool. Temp of water was 91.  Pt entered/exited the pool via stairs (step through pattern) independently with bilat rail.     Warm up: heel/toe walking x4 laps across pool chest deep side stepping x4 laps from shallow to deep;    Ambulation without noodle support backward stepping cues for long stride to engage glute.   Pt amb backward 2 widths between exercises for recovery    Seated Stretching gastroc, hamstring and adductors 3 x 20-25 second hold Knee flex/ext 2 x 15 reps cues for increased speed for increased resistance.   Standing Step ups bottom step x 10 forward. Backward x 5 lateral x 5. Core strengthening using hand buoys -vertical<>supine and prone -Quad and hip extension stretch using water bench Using buoy: high knee marching x 15    Standing leg press: noodle kick down, with ankle buoy included core engagement 3 x 10 reps hip forward and in external rotation. UE support bilat with hand buoys decreased to unilateral         Plan - 01/18/21 0855     Clinical Impression Statement Saw Dr Shelle Iron.  Was offered a cortizone shot for left knee but pt declined for now tryng strengthening instead to control pain.  No c/o right hip pain. Pt reporting knee is better.  She does avoid climbing steps when able using step to pattern as needed. Completes Forward/backward amb without left knee discomfort. Lateral movements increase pressure/pain.  I added a new HEP as last one misplaced.  Pt approaching maximal potential in pool.  She is interested in using land based resistive equipment going forward.    Stability/Clinical Decision Making Evolving/Moderate complexity    Clinical Decision Making Low    Rehab Potential Excellent    PT Frequency 2x / week    PT Duration 6 weeks    PT Treatment/Interventions ADLs/Self Care Home Management;Cryotherapy;Electrical Stimulation;Moist Heat;Traction;Ultrasound;DME Instruction;Gait training;Stair training;Functional mobility training;Therapeutic activities;Therapeutic exercise;Neuromuscular re-education;Patient/family education;Manual techniques;Manual lymph drainage;Iontophoresis 4mg /ml Dexamethasone;Passive range of motion;Dry needling;Taping    PT Next Visit Plan advance glute and quad strengthening as tolerated.    PT Home Exercise Plan aquatic HEP [id number].  Will need expination.             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   Follow-up examination after eye surgery 08/03/2019   Posterior capsular opacification non visually significant of right eye 08/03/2019   Right epiretinal membrane 08/03/2019   Pseudophakia of both eyes 08/03/2019   Left epiretinal membrane 08/03/2019   Hypothyroidism 08/28/2011   Menopausal symptoms 08/28/2011   Decreased libido 08/28/2011   Hypercholesterolemia 08/28/2011   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

## 2021-01-18 NOTE — Therapy (Signed)
[facility] [address] [place], [place], [address] Phone: [phone]   Fax:  [phone]  Physical Therapy Treatment  Patient Details  Name: Erica Chase MRN: [id number] Date of Birth: 1945-06-06 Referring Provider (PT): Dr Cherlynn June   Encounter Date: 01/18/2021   PT End of Session - 01/18/21 0820     Visit Number 11    Number of Visits 15    Date for PT Re-Evaluation 01/30/21    Authorization Type [employer]    PT Start Time 0820    Equipment Utilized During Treatment Other (comment)    Activity Tolerance Patient tolerated treatment well    Behavior During Therapy [facility] for tasks assessed/performed             Past Medical History:  Diagnosis Date   Abdominal pain 2004   ADD (attention deficit disorder) 08/2010   Allergic rhinitis 01/2011   Asthma    unsure-,reflux ruled out using nasonex and pulmocort   Atypical endocervical cells on Pap smear 1999   CIN III (cervical intraepithelial neoplasia grade III) with severe dysplasia 2001   FHx: cancer    HSV infection 1997   Hyperlipidemia 2018   Hypothyroidism    Libido, decreased 2011   Menopausal symptoms 2010   Orgasmic dysfunction 2006   Ovarian cyst    Pelvic relaxation 12/2000   PONV (postoperative nausea and vomiting)    scop patch has worked well in past   Sinusitis 01/2011   Stroke [facility])     Past Surgical History:  Procedure Laterality Date   CARPAL TUNNEL RELEASE     CATARACT EXTRACTION Left 2015   CHOLECYSTECTOMY  01/2011   KNEE ARTHROSCOPY     KNEE ARTHROSCOPY  03/05/2011   Procedure: ARTHROSCOPY KNEE;  Surgeon: Javier Docker;  Location: [facility];  Service: Orthopedics;  Laterality: Left;  left knee scope with debridement , Partial lateral meniscectomy   ROTATOR CUFF REPAIR     THUMB ARTHROSCOPY     thumb surgery      There were no vitals filed for this  visit.                                 PT Short Term Goals - 12/19/20 1010       PT SHORT TERM GOAL #1   Title Patient will report a 50% reduction in tendereness to palpation of the patella    Time 3    Period Weeks    Status On-going    Target Date 01/05/21      PT SHORT TERM GOAL #2   Title Patient will demonstrate full pain free ROM of the left knee    Time 3    Period Weeks    Status On-going    Target Date 01/05/21      PT SHORT TERM GOAL #3   Title Patient will demonstrate 30 sec of left single leg stance without pain    Time 3    Period Weeks    Status On-going    Target Date 01/05/21      PT SHORT TERM GOAL #4   Title Patient will increase right hip flexion strength to 4+/5    Time 3    Period Weeks    Status New    Target Date 01/09/21               PT Long  Term Goals - 12/19/20 1013       PT LONG TERM GOAL #1   Title Patient will transfer sit to stand without pain    Baseline continues to have tightness in the knee and hip    Time 6    Period Weeks    Status On-going    Target Date 01/30/21      PT LONG TERM GOAL #2   Title Patient will go up/down steps without pain    Baseline has significant pain since her knee popped    Time 6    Period Weeks    Status On-going    Target Date 01/30/21      PT LONG TERM GOAL #3   Title Patient wll return to hiking without increased pain    Baseline unable to hike    Time 6    Period Weeks    Status On-going    Target Date 01/30/21                    Patient will benefit from skilled therapeutic intervention in order to improve the following deficits and impairments:     Visit Diagnosis: No diagnosis found.     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

## 2021-01-20 ENCOUNTER — Encounter (HOSPITAL_BASED_OUTPATIENT_CLINIC_OR_DEPARTMENT_OTHER): Payer: Self-pay | Admitting: Physical Therapy

## 2021-01-20 ENCOUNTER — Ambulatory Visit (HOSPITAL_BASED_OUTPATIENT_CLINIC_OR_DEPARTMENT_OTHER): Payer: Medicare PPO | Admitting: Physical Therapy

## 2021-01-20 ENCOUNTER — Other Ambulatory Visit: Payer: Self-pay

## 2021-01-20 DIAGNOSIS — M6281 Muscle weakness (generalized): Secondary | ICD-10-CM | POA: Diagnosis not present

## 2021-01-20 DIAGNOSIS — R262 Difficulty in walking, not elsewhere classified: Secondary | ICD-10-CM | POA: Diagnosis not present

## 2021-01-20 DIAGNOSIS — R2689 Other abnormalities of gait and mobility: Secondary | ICD-10-CM

## 2021-01-20 DIAGNOSIS — M25562 Pain in left knee: Secondary | ICD-10-CM

## 2021-01-20 DIAGNOSIS — M25561 Pain in right knee: Secondary | ICD-10-CM | POA: Diagnosis not present

## 2021-01-20 DIAGNOSIS — R293 Abnormal posture: Secondary | ICD-10-CM | POA: Diagnosis not present

## 2021-01-20 DIAGNOSIS — G8929 Other chronic pain: Secondary | ICD-10-CM | POA: Diagnosis not present

## 2021-01-20 DIAGNOSIS — R2681 Unsteadiness on feet: Secondary | ICD-10-CM | POA: Diagnosis not present

## 2021-01-20 DIAGNOSIS — M25551 Pain in right hip: Secondary | ICD-10-CM

## 2021-01-20 NOTE — Therapy (Signed)
[facility] [place] [address] [place], [place], [address] Phone: [phone]   Fax:  [phone]  Physical Therapy Treatment/progress note Progress Note Reporting Period 12/14/2020 to 01/20/2021  See note below for Objective Data and Assessment of Progress/Goals.     Patient Details  Name: Erica Chase MRN: [id number] Date of Birth: September 11, 1945 Referring Provider (PT): Dr Lenor Derrick   Encounter Date: 01/20/2021   PT End of Session - 01/20/21 1310     Visit Number 12    Number of Visits 16    Date for PT Re-Evaluation 03/03/21    Authorization Type [employer]    PT Start Time 0845    PT Stop Time 0930    PT Time Calculation (min) 45 min    Activity Tolerance Patient tolerated treatment well    Behavior During Therapy [facility] for tasks assessed/performed             Past Medical History:  Diagnosis Date   Abdominal pain 2004   ADD (attention deficit disorder) 08/2010   Allergic rhinitis 01/2011   Asthma    unsure-,reflux ruled out using nasonex and pulmocort   Atypical endocervical cells on Pap smear 1999   CIN III (cervical intraepithelial neoplasia grade III) with severe dysplasia 2001   FHx: cancer    HSV infection 1997   Hyperlipidemia 2018   Hypothyroidism    Libido, decreased 2011   Menopausal symptoms 2010   Orgasmic dysfunction 2006   Ovarian cyst    Pelvic relaxation 12/2000   PONV (postoperative nausea and vomiting)    scop patch has worked well in past   Sinusitis 01/2011   Stroke [facility])     Past Surgical History:  Procedure Laterality Date   CARPAL TUNNEL RELEASE     CATARACT EXTRACTION Left 2015   CHOLECYSTECTOMY  01/2011   KNEE ARTHROSCOPY     KNEE ARTHROSCOPY  03/05/2011   Procedure: ARTHROSCOPY KNEE;  Surgeon: Johnn Hai;  Location: [place];  Service: Orthopedics;  Laterality: Left;  left knee scope with debridement , Partial lateral meniscectomy   ROTATOR CUFF REPAIR      THUMB ARTHROSCOPY     thumb surgery      There were no vitals filed for this visit.   Subjective Assessment - 01/20/21 1309     Subjective Patiet has seen the MD. She will not have surgery at this time. The patient reports she is still having difficulty going up and down steps with her left knee. She reports her right hip is improving.    Pertinent History Stroke/TIA: aysymtomatic at this time 10-15 years ago; left knee pain    How long can you sit comfortably? Sit to stand    How long can you walk comfortably? ncreased pain on uneven surfaces    Diagnostic tests C-ray: negaitve per patient    Patient Stated Goals to have less pain    Currently in Pain? No/denies    Multiple Pain Sites No                [facility] PT Assessment - 01/20/21 0001       Assessment   Medical Diagnosis Left Patellar Tendonitis/ Right hip and low back pain    Referring Provider (PT) Dr Lenor Derrick      Squat   Comments Patient able to squat without shifting away from the left hip. Patient sitll veyr limited by the knee.      Single Leg  Stance   Comments Patient able to maintain single leg stance on both sides better then      Strength   Right Hip Flexion 4+/5    Right Hip ABduction 5/5    Right Hip ADduction 5/5    Left Hip Flexion 4/5    Left Hip ABduction 5/5    Left Hip ADduction 5/5    Right Knee Flexion 5/5    Right Knee Extension 5/5    Left Knee Flexion 5/5    Left Knee Extension 4+/5                           [facility] Adult PT Treatment/Exercise - 01/20/21 0001       Knee/Hip Exercises: Machines for Strengthening   Cybex Knee Extension patient asked about knee extension machine; patient advised open chain exercises at this time may not be the best.    Other Machine attmepted to warm patient up with bike but patient reports she can not do the bike.      Knee/Hip Exercises: Standing   Other Standing Knee Exercises lateral band walk x10 each direction pain  noted in the left knee; monster walk x10 no pain noted when band was moved up.    Other Standing Knee Exercises reviewed step up x10 4 inch; lateral step up x10 4 inch; and step down 4 inch                     PT Education - 01/20/21 1310     Education Details reviewed HEP    Person(s) Educated Patient    Methods Explanation;Demonstration;Other (comment)    Comprehension Need further instruction              PT Short Term Goals - 01/20/21 1322       PT SHORT TERM GOAL #1   Title Patient will report a 50% reduction in tendereness to palpation of the patella    Baseline less TTP    Time 3    Period Weeks    Status Achieved    Target Date 01/05/21      PT SHORT TERM GOAL #2   Title Patient will demonstrate full pain free ROM of the left knee    Baseline continues to have pain but can get to deep end range on the left    Time 3    Period Weeks    Status Partially Met      PT SHORT TERM GOAL #3   Title Patient will demonstrate 30 sec of left single leg stance without pain    Baseline some instability on land but can hold for the time    Time 3    Period Weeks    Status On-going    Target Date 01/05/21      PT SHORT TERM GOAL #4   Title Patient will increase right hip flexion strength to 4+/5    Baseline still some strength limitations with left hip flexion    Time 3    Period Weeks    Status Partially Met    Target Date 03/03/21               PT Long Term Goals - 01/20/21 1325       PT LONG TERM GOAL #1   Title Patient will transfer sit to stand without pain    Baseline improved but can still hace pain    Time 6  Period Weeks    Status On-going      PT LONG TERM GOAL #2   Title Patient will go up/down steps without pain    Baseline pain going down. Improving going up    Time 6    Period Weeks    Status On-going      PT LONG TERM GOAL #3   Title Patient wll return to hiking without increased pain    Baseline unable to hike    Time  6    Period Weeks    Status On-going                   Plan - 01/20/21 1314     Clinical Impression Statement Therapy re-assessed the patient today. She has shown improved single leg stance time and stability. She also is also squatting better. She is limited by her left knee today. On initial eval she was shifting away from her right hip. Her strength is about the same. She is somehat better going up the stpes but still limited going down. She has a full pool program but could use some work reviewing it. She would benefit from further skilled therapy 1W6 to improve ability to perform steps. Therapy advanced home land HEP today. She dwas intrested in doing knee extension machine. She was advised open chained exercises are not her best option. We reviewed some closed chain options.    Personal Factors and Comorbidities Comorbidity 1    Comorbidities left knee scop >10 years    Examination-Activity Limitations Stand;Stairs;Squat;Sit    Stability/Clinical Decision Making Evolving/Moderate complexity    Clinical Decision Making Low    Rehab Potential Excellent    PT Frequency 1x / week    PT Duration 4 weeks    PT Treatment/Interventions ADLs/Self Care Home Management;Cryotherapy;Electrical Stimulation;Moist Heat;Traction;Ultrasound;DME Instruction;Gait training;Stair training;Functional mobility training;Therapeutic activities;Therapeutic exercise;Neuromuscular re-education;Patient/family education;Manual techniques;Manual lymph drainage;Iontophoresis 24m/ml Dexamethasone;Passive range of motion;Dry needling;Taping    PT Next Visit Plan advance glute and quad strengthening as tolerated.    PT Home Exercise Plan aquatic HEP [id number].  Will need expination.    Consulted and Agree with Plan of Care Patient             Patient will benefit from skilled therapeutic intervention in order to improve the following deficits and impairments:  Decreased mobility, Difficulty walking, Pain,  Decreased strength, Decreased activity tolerance  Visit Diagnosis: Acute pain of left knee  Other abnormalities of gait and mobility  Pain in right hip  Referring diagnosis? Left knee pain  Treatment diagnosis? (if different than referring diagnosis) left knee pain  What was this (referring dx) caused by? '[]'  Surgery '[]'  Fall '[x]'  Ongoing issue '[]'  Arthritis '[]'  Other: ____________  Laterality: '[]'  Rt '[x]'  Lt '[]'  Both  Check all possible CPT codes:      '[x]'  97110 (Therapeutic Exercise)  '[]'  92507 (SLP Treatment)  '[x]'  97112 (Neuro Re-ed)   '[]'  92526 (Swallowing Treatment)   '[x]'  97116 (Gait Training)   '[]'  [id number](Cognitive Training, 1st 15 minutes) '[x]'  97140 (Manual Therapy)   '[]'  97130 (Cognitive Training, each add'l 15 minutes)  '[x]'  97530 (Therapeutic Activities)  '[]'  Other, List CPT Code ____________    '[]'  910626(Self Care)       '[]'  All codes above (97110 - 97535)  '[]'  97012 (Mechanical Traction)  '[x]'  97014 (E-stim Unattended)  '[]'  97032 (E-stim manual)  '[x]'  97033 (Ionto)  '[x]'  97035 (Ultrasound)  '[]'  97760 (Orthotic Fit) '[]'  [id number](Physical Performance Training) [  x] [phone] (Aquatic Therapy) '[]'  97034 (Contrast Bath) '[]'  [phone] (Paraffin) '[]'  97597 (Wound Care 1st 20 sq cm) '[]'  97598 (Wound Care each add'l 20 sq cm) '[x]'  97016 (Vasopneumatic Device) '[]'  [phone] (Orthotic Training) '[]'  [phone] (Prosthetic Training)    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   Follow-up examination after eye surgery 08/03/2019   Posterior capsular opacification non visually significant of right eye 08/03/2019   Right epiretinal membrane 08/03/2019   Pseudophakia of both eyes 08/03/2019   Left epiretinal membrane 08/03/2019   Hypothyroidism 08/28/2011   Menopausal symptoms 08/28/2011   Decreased libido 08/28/2011   Hypercholesterolemia  08/28/2011   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

## 2021-01-25 ENCOUNTER — Ambulatory Visit (HOSPITAL_BASED_OUTPATIENT_CLINIC_OR_DEPARTMENT_OTHER): Payer: Medicare PPO | Admitting: Physical Therapy

## 2021-01-25 DIAGNOSIS — M25562 Pain in left knee: Secondary | ICD-10-CM | POA: Diagnosis not present

## 2021-01-30 ENCOUNTER — Ambulatory Visit (HOSPITAL_BASED_OUTPATIENT_CLINIC_OR_DEPARTMENT_OTHER): Payer: Medicare PPO | Admitting: Physical Therapy

## 2021-01-30 ENCOUNTER — Encounter (HOSPITAL_BASED_OUTPATIENT_CLINIC_OR_DEPARTMENT_OTHER): Payer: Self-pay | Admitting: Physical Therapy

## 2021-01-30 ENCOUNTER — Other Ambulatory Visit: Payer: Self-pay

## 2021-01-30 DIAGNOSIS — G8929 Other chronic pain: Secondary | ICD-10-CM

## 2021-01-30 DIAGNOSIS — M6281 Muscle weakness (generalized): Secondary | ICD-10-CM | POA: Diagnosis not present

## 2021-01-30 DIAGNOSIS — R262 Difficulty in walking, not elsewhere classified: Secondary | ICD-10-CM | POA: Diagnosis not present

## 2021-01-30 DIAGNOSIS — R293 Abnormal posture: Secondary | ICD-10-CM | POA: Diagnosis not present

## 2021-01-30 DIAGNOSIS — R2689 Other abnormalities of gait and mobility: Secondary | ICD-10-CM | POA: Diagnosis not present

## 2021-01-30 DIAGNOSIS — R2681 Unsteadiness on feet: Secondary | ICD-10-CM

## 2021-01-30 DIAGNOSIS — M25551 Pain in right hip: Secondary | ICD-10-CM | POA: Diagnosis not present

## 2021-01-30 DIAGNOSIS — M25561 Pain in right knee: Secondary | ICD-10-CM | POA: Diagnosis not present

## 2021-01-30 DIAGNOSIS — M25562 Pain in left knee: Secondary | ICD-10-CM | POA: Diagnosis not present

## 2021-01-30 NOTE — Therapy (Signed)
[facility] [place] [address] [place], [place], [address] Phone: [phone]   Fax:  [phone]  Physical Therapy Treatment  Patient Details  Name: Erica Chase MRN: [id number] Date of Birth: July 18, 1945 Referring Provider (PT): Dr Lenor Derrick   Encounter Date: 01/30/2021   PT End of Session - 01/30/21 0910     Visit Number 13    Number of Visits 16    Date for PT Re-Evaluation 03/03/21    Authorization Type [employer]    PT Start Time 0900    PT Stop Time 0945    PT Time Calculation (min) 45 min    Equipment Utilized During Treatment Other (comment)    Activity Tolerance Patient tolerated treatment well    Behavior During Therapy [facility] for tasks assessed/performed             Past Medical History:  Diagnosis Date   Abdominal pain 2004   ADD (attention deficit disorder) 08/2010   Allergic rhinitis 01/2011   Asthma    unsure-,reflux ruled out using nasonex and pulmocort   Atypical endocervical cells on Pap smear 1999   CIN III (cervical intraepithelial neoplasia grade III) with severe dysplasia 2001   FHx: cancer    HSV infection 1997   Hyperlipidemia 2018   Hypothyroidism    Libido, decreased 2011   Menopausal symptoms 2010   Orgasmic dysfunction 2006   Ovarian cyst    Pelvic relaxation 12/2000   PONV (postoperative nausea and vomiting)    scop patch has worked well in past   Sinusitis 01/2011   Stroke [facility])     Past Surgical History:  Procedure Laterality Date   CARPAL TUNNEL RELEASE     CATARACT EXTRACTION Left 2015   CHOLECYSTECTOMY  01/2011   KNEE ARTHROSCOPY     KNEE ARTHROSCOPY  03/05/2011   Procedure: ARTHROSCOPY KNEE;  Surgeon: Johnn Hai;  Location: [place];  Service: Orthopedics;  Laterality: Left;  left knee scope with debridement , Partial lateral meniscectomy   ROTATOR CUFF REPAIR     THUMB ARTHROSCOPY     thumb surgery      There were no vitals filed for this  visit.   Subjective Assessment - 01/30/21 0907     Subjective Pt reporting she did get cortizone shot. Knee is feeling better.    Pain Score 2     Pain Location Hip    Pain Orientation Left    Pain Descriptors / Indicators Aching;Burning    Pain Type Chronic pain    Pain Frequency Intermittent    Pain Score 2    Pain Location Knee    Pain Orientation Left    Pain Descriptors / Indicators Aching    Pain Type Chronic pain    Pain Frequency Intermittent                                          PT Short Term Goals - 01/20/21 1322       PT SHORT TERM GOAL #1   Title Patient will report a 50% reduction in tendereness to palpation of the patella    Baseline less TTP    Time 3    Period Weeks    Status Achieved    Target Date 01/05/21      PT SHORT TERM GOAL #2   Title Patient will demonstrate full  pain free ROM of the left knee    Baseline continues to have pain but can get to deep end range on the left    Time 3    Period Weeks    Status Partially Met      PT SHORT TERM GOAL #3   Title Patient will demonstrate 30 sec of left single leg stance without pain    Baseline some instability on land but can hold for the time    Time 3    Period Weeks    Status On-going    Target Date 01/05/21      PT SHORT TERM GOAL #4   Title Patient will increase right hip flexion strength to 4+/5    Baseline still some strength limitations with left hip flexion    Time 3    Period Weeks    Status Partially Met    Target Date 03/03/21               PT Long Term Goals - 01/20/21 1325       PT LONG TERM GOAL #1   Title Patient will transfer sit to stand without pain    Baseline improved but can still hace pain    Time 6    Period Weeks    Status On-going      PT LONG TERM GOAL #2   Title Patient will go up/down steps without pain    Baseline pain going down. Improving going up    Time 6    Period Weeks    Status On-going      PT LONG TERM  GOAL #3   Title Patient wll return to hiking without increased pain    Baseline unable to hike    Time 6    Period Weeks    Status On-going           Pt seen for aquatic therapy today.  Treatment took place in water 3.25-4 ft in depth at the [employer] pool. Temp of water was 91.  Pt entered/exited the pool via stairs (step through pattern) independently with bilat rail.     Warm up: heel/toe walking x4 laps across pool chest deep side stepping x4 laps from shallow to deep;    Ambulation without noodle support backward stepping cues for long stride to engage glute.   Pt amb backward 2 widths between exercises for recovery   Seated Stretching gastroc, hamstring and adductors 3 x 20-25 second hold Knee flex/ext 2 x 15 reps cues for increased speed for increased resistance.   Standing Long leg kicks hip flex rapid 2 x 10 then slow 2 x 10 -extension rapid 2 x 10, slow 2 x 10   Water band in standing: hip extension, flex , abduction and adduction as tolerated. X 10 reps. Rle hip ext increases LBP.          Plan - 01/31/21 1542     Clinical Impression Statement Pt did decide to get cortizone shot in left knee at end of last week.  She reports knee is feeling better.  Some c/o LBP today which limits some exercises.  Focused on indep proximal LE exercises today. Pt tolerates hip flex well, hip extension limited 50% by LBP and add/abd not tolertaed.  She was experiencing a visual migraine when she srrived which subsided as treatment continued.    Stability/Clinical Decision Making Evolving/Moderate complexity    Clinical Decision Making Low    Rehab Potential Excellent  PT Frequency 1x / week    PT Duration 4 weeks    PT Treatment/Interventions ADLs/Self Care Home Management;Cryotherapy;Electrical Stimulation;Moist Heat;Traction;Ultrasound;DME Instruction;Gait training;Stair training;Functional mobility training;Therapeutic activities;Therapeutic  exercise;Neuromuscular re-education;Patient/family education;Manual techniques;Manual lymph drainage;Iontophoresis 32m/ml Dexamethasone;Passive range of motion;Dry needling;Taping    PT Home Exercise Plan Long leg rapid hip extension and flex             Patient will benefit from skilled therapeutic intervention in order to improve the following deficits and impairments:  Decreased mobility, Difficulty walking, Pain, Decreased strength, Decreased activity tolerance  Visit Diagnosis: Abnormal posture  Chronic pain of right knee  Difficulty in walking, not elsewhere classified  Muscle weakness (generalized)  Unsteadiness on feet  Other abnormalities of gait and mobility     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   Follow-up examination after eye surgery 08/03/2019   Posterior capsular opacification non visually significant of right eye 08/03/2019   Right epiretinal membrane 08/03/2019   Pseudophakia of both eyes 08/03/2019   Left epiretinal membrane 08/03/2019   Hypothyroidism 08/28/2011   Menopausal symptoms 08/28/2011   Decreased libido 08/28/2011   Hypercholesterolemia 08/28/2011   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

## 2021-02-01 ENCOUNTER — Ambulatory Visit (HOSPITAL_BASED_OUTPATIENT_CLINIC_OR_DEPARTMENT_OTHER): Payer: Medicare PPO | Admitting: Physical Therapy

## 2021-02-07 ENCOUNTER — Ambulatory Visit (HOSPITAL_BASED_OUTPATIENT_CLINIC_OR_DEPARTMENT_OTHER): Payer: Medicare PPO | Admitting: Physical Therapy

## 2021-02-15 ENCOUNTER — Other Ambulatory Visit: Payer: Self-pay

## 2021-02-15 ENCOUNTER — Ambulatory Visit (HOSPITAL_BASED_OUTPATIENT_CLINIC_OR_DEPARTMENT_OTHER): Payer: Medicare PPO | Attending: Family Medicine | Admitting: Physical Therapy

## 2021-02-15 ENCOUNTER — Encounter (HOSPITAL_BASED_OUTPATIENT_CLINIC_OR_DEPARTMENT_OTHER): Payer: Self-pay | Admitting: Physical Therapy

## 2021-02-15 DIAGNOSIS — M25561 Pain in right knee: Secondary | ICD-10-CM | POA: Insufficient documentation

## 2021-02-15 DIAGNOSIS — G8929 Other chronic pain: Secondary | ICD-10-CM | POA: Insufficient documentation

## 2021-02-15 DIAGNOSIS — R2689 Other abnormalities of gait and mobility: Secondary | ICD-10-CM | POA: Diagnosis not present

## 2021-02-15 DIAGNOSIS — R2681 Unsteadiness on feet: Secondary | ICD-10-CM | POA: Diagnosis not present

## 2021-02-15 DIAGNOSIS — R262 Difficulty in walking, not elsewhere classified: Secondary | ICD-10-CM | POA: Diagnosis not present

## 2021-02-15 DIAGNOSIS — M6281 Muscle weakness (generalized): Secondary | ICD-10-CM | POA: Insufficient documentation

## 2021-02-15 DIAGNOSIS — R293 Abnormal posture: Secondary | ICD-10-CM | POA: Diagnosis not present

## 2021-02-15 NOTE — Therapy (Signed)
[facility] [address] [place], [place], [address] Phone: [phone]   Fax:  [phone]  Physical Therapy Treatment  Patient Details  Name: Erica Chase MRN: [id number] Date of Birth: 08-26-1945 Referring Provider (PT): Dr Lenor Derrick   Encounter Date: 02/15/2021   PT End of Session - 02/15/21 1549     Visit Number 14    Number of Visits 16    Date for PT Re-Evaluation 03/03/21    Authorization Type [employer]    PT Start Time 1500    PT Stop Time 1545    PT Time Calculation (min) 45 min    Equipment Utilized During Treatment Other (comment)    Activity Tolerance Patient tolerated treatment well    Behavior During Therapy [facility] for tasks assessed/performed             Past Medical History:  Diagnosis Date   Abdominal pain 2004   ADD (attention deficit disorder) 08/2010   Allergic rhinitis 01/2011   Asthma    unsure-,reflux ruled out using nasonex and pulmocort   Atypical endocervical cells on Pap smear 1999   CIN III (cervical intraepithelial neoplasia grade III) with severe dysplasia 2001   FHx: cancer    HSV infection 1997   Hyperlipidemia 2018   Hypothyroidism    Libido, decreased 2011   Menopausal symptoms 2010   Orgasmic dysfunction 2006   Ovarian cyst    Pelvic relaxation 12/2000   PONV (postoperative nausea and vomiting)    scop patch has worked well in past   Sinusitis 01/2011   Stroke [facility])     Past Surgical History:  Procedure Laterality Date   CARPAL TUNNEL RELEASE     CATARACT EXTRACTION Left 2015   CHOLECYSTECTOMY  01/2011   KNEE ARTHROSCOPY     KNEE ARTHROSCOPY  03/05/2011   Procedure: ARTHROSCOPY KNEE;  Surgeon: Johnn Hai;  Location: [facility];  Service: Orthopedics;  Laterality: Left;  left knee scope with debridement , Partial lateral meniscectomy   ROTATOR CUFF REPAIR     THUMB ARTHROSCOPY     thumb surgery      There were no vitals filed for this  visit.   Subjective Assessment - 02/15/21 1505     Subjective "knee is better.  walking 0/10 stair climbing 2/10, right hip 5/10 getting out of chair then to 0/10.    Pain Location Knee    Pain Orientation Left    Pain Descriptors / Indicators Aching    Pain Type Chronic pain    Pain Onset 1 to 4 weeks ago    Pain Frequency Intermittent    Aggravating Factors  stair climbing. pain 0/10 walking                                          PT Short Term Goals - 01/20/21 1322       PT SHORT TERM GOAL #1   Title Patient will report a 50% reduction in tendereness to palpation of the patella    Baseline less TTP    Time 3    Period Weeks    Status Achieved    Target Date 01/05/21      PT SHORT TERM GOAL #2   Title Patient will demonstrate full pain free ROM of the left knee    Baseline continues to have pain but can  get to deep end range on the left    Time 3    Period Weeks    Status Partially Met      PT SHORT TERM GOAL #3   Title Patient will demonstrate 30 sec of left single leg stance without pain    Baseline some instability on land but can hold for the time    Time 3    Period Weeks    Status On-going    Target Date 01/05/21      PT SHORT TERM GOAL #4   Title Patient will increase right hip flexion strength to 4+/5    Baseline still some strength limitations with left hip flexion    Time 3    Period Weeks    Status Partially Met    Target Date 03/03/21               PT Long Term Goals - 01/20/21 1325       PT LONG TERM GOAL #1   Title Patient will transfer sit to stand without pain    Baseline improved but can still hace pain    Time 6    Period Weeks    Status On-going      PT LONG TERM GOAL #2   Title Patient will go up/down steps without pain    Baseline pain going down. Improving going up    Time 6    Period Weeks    Status On-going      PT LONG TERM GOAL #3   Title Patient wll return to hiking without  increased pain    Baseline unable to hike    Time 6    Period Weeks    Status On-going                Pt seen for aquatic therapy today.  Treatment took place in water 3.25-4 ft in depth at the [facility] pool. Temp of water was 91.  Pt entered/exited the pool via stairs (step through pattern) independently with bilat rail.  Warm up: heel/toe walking x4 laps across pool chest deep side stepping x4 laps from shallow to deep;     Seated Stretching gastroc, hamstring and adductors 3 x 20-25 second hold Knee flex/ext 2 x 15 reps cues for increased speed for increased resistance.   Standing Long leg kicks hip flex  2 x 10 -extension 2 x 10 - hip flex then adduction 3 point x 10 -hip abd then ext/flex 3 point x 10 R/L  Balance/core strength Pilates Lat pull down 2x10 yellow then lue noodle Planks able to gain modified position indep, difficulty gaining otherwise.  Good core activation Reversed plank-unable to gain poition but good core activation Sitting on noodle. Multiple tries to gain position with control. Challenged to lift hands out of water        Plan - 02/15/21 1550     Clinical Impression Statement Decreased lef knee pain since cortizone shot. Continues to have some discmfort descending steps although able to complete in alternating pattern. Ascended step out of pool wthout pain. Added water pilates for core strengthening. Pt unable to gain planknor reverse plank position without manual assist. Did get good core activation with attempts. Right hip with occasional discomofrt as reported when getting up from chase lounger at home andsome minor left LB discomfort with session today. Pt is just returning from trip to [place] for 2 weeks.           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   Follow-up examination after eye surgery 08/03/2019   Posterior capsular opacification non visually significant of right eye 08/03/2019   Right epiretinal membrane 08/03/2019   Pseudophakia of both eyes 08/03/2019   Left epiretinal membrane 08/03/2019   Hypothyroidism 08/28/2011   Menopausal symptoms 08/28/2011   Decreased libido 08/28/2011   Hypercholesterolemia 08/28/2011   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

## 2021-02-22 ENCOUNTER — Ambulatory Visit (HOSPITAL_BASED_OUTPATIENT_CLINIC_OR_DEPARTMENT_OTHER): Payer: Medicare PPO | Admitting: Physical Therapy

## 2021-02-22 ENCOUNTER — Encounter (HOSPITAL_BASED_OUTPATIENT_CLINIC_OR_DEPARTMENT_OTHER): Payer: Self-pay | Admitting: Physical Therapy

## 2021-02-22 ENCOUNTER — Other Ambulatory Visit: Payer: Self-pay

## 2021-02-22 DIAGNOSIS — M6281 Muscle weakness (generalized): Secondary | ICD-10-CM

## 2021-02-22 DIAGNOSIS — R2689 Other abnormalities of gait and mobility: Secondary | ICD-10-CM

## 2021-02-22 DIAGNOSIS — R293 Abnormal posture: Secondary | ICD-10-CM | POA: Diagnosis not present

## 2021-02-22 DIAGNOSIS — M25561 Pain in right knee: Secondary | ICD-10-CM

## 2021-02-22 DIAGNOSIS — R2681 Unsteadiness on feet: Secondary | ICD-10-CM

## 2021-02-22 DIAGNOSIS — R262 Difficulty in walking, not elsewhere classified: Secondary | ICD-10-CM | POA: Diagnosis not present

## 2021-02-22 DIAGNOSIS — G8929 Other chronic pain: Secondary | ICD-10-CM

## 2021-02-22 NOTE — Therapy (Signed)
[facility] [address] [place], [place], [address] Phone: [phone]   Fax:  [phone]  Physical Therapy Treatment  Patient Details  Name: Erica Chase MRN: [id number] Date of Birth: [birth year] Referring Provider (PT): Dr Lenor Derrick   Encounter Date: 02/22/2021   PT End of Session - 02/22/21 1851     Visit Number 15    Number of Visits 16    Date for PT Re-Evaluation 03/03/21    Authorization Type [employer]    PT Start Time 8270    PT Stop Time 1710    PT Time Calculation (min) 39 min    Equipment Utilized During Treatment Other (comment)    Activity Tolerance Patient tolerated treatment well    Behavior During Therapy [facility] for tasks assessed/performed             Past Medical History:  Diagnosis Date   Abdominal pain 2004   ADD (attention deficit disorder) 08/2010   Allergic rhinitis 01/2011   Asthma    unsure-,reflux ruled out using nasonex and pulmocort   Atypical endocervical cells on Pap smear 1999   CIN III (cervical intraepithelial neoplasia grade III) with severe dysplasia 2001   FHx: cancer    HSV infection 1997   Hyperlipidemia 2018   Hypothyroidism    Libido, decreased 2011   Menopausal symptoms 2010   Orgasmic dysfunction 2006   Ovarian cyst    Pelvic relaxation 12/2000   PONV (postoperative nausea and vomiting)    scop patch has worked well in past   Sinusitis 01/2011   Stroke [facility])     Past Surgical History:  Procedure Laterality Date   CARPAL TUNNEL RELEASE     CATARACT EXTRACTION Left 2015   CHOLECYSTECTOMY  01/2011   KNEE ARTHROSCOPY     KNEE ARTHROSCOPY  03/05/2011   Procedure: ARTHROSCOPY KNEE;  Surgeon: Johnn Hai;  Location: [facility];  Service: Orthopedics;  Laterality: Left;  left knee scope with debridement , Partial lateral meniscectomy   ROTATOR CUFF REPAIR     THUMB ARTHROSCOPY     thumb surgery      There were no vitals filed for this  visit.   Subjective Assessment - 02/22/21 1849     Subjective Pt reports some knee pain has returned, medial aspect of knee with some movement laterally.  States she turned funny and it returned.    Currently in Pain? Yes    Pain Score 1     Pain Location Hip    Pain Orientation Left    Pain Descriptors / Indicators Aching    Pain Type Chronic pain    Pain Frequency Intermittent    Pain Score 4    Pain Location Knee    Pain Orientation Left    Pain Descriptors / Indicators Aching    Pain Onset 1 to 4 weeks ago    Pain Frequency Intermittent                                        PT Education - 02/22/21 1851     Education Details HEP completion, modalities use resting, activity level    Person(s) Educated Patient    Methods Explanation    Comprehension Verbalized understanding              PT Short Term Goals - 02/22/21 1854  PT SHORT TERM GOAL #2   Baseline full ROM although continues with some discomfort 11/23    Status Partially Met      PT SHORT TERM GOAL #3   Baseline complees in pool without pain.  On land with some discomfort    Status Partially Met      PT SHORT TERM GOAL #4   Title 4+/5 measured today but with some sight discomfort    Status Achieved               PT Long Term Goals - 02/22/21 1858       PT LONG TERM GOAL #1   Title STS from 90/90 chair without discomfort. 11/23    Status Achieved      PT LONG TERM GOAL #2   Title pain with going down    Status Partially Met      PT LONG TERM GOAL #3   Title Pt reports not completing due to apprehensiveness of increasing hip and knee pain.    Status Not Met            Pt seen for aquatic therapy today.  Treatment took place in water 3.25-4 ft in depth at the [facility] pool. Temp of water was 91.  Pt entered/exited the pool via stairs (step through pattern) independently with bilat rail.  Warm up: heel/toe walking x4 laps across pool  chest deep side stepping x4 laps from shallow to deep;     Seated Stretching gastroc, hamstring and adductors 3 x 20-25 second hold Knee flex/ext 3 x 20 reps cues for increased speed for increased resistance.   Standing Cycling supported by yellow hand buoys x 10 minutes with rest periods as tolerated.  Attempts for scissoring and add/abd but with increased knee discomfort.   Pt edu on management of knee discomfort, preservation of joints and activity levels with and without knee/hip pain.       Plan - 02/22/21 1852     Clinical Impression Statement Return of some knee discomfort. Pt with obvious frustration.  Last visit. Pt VU of HEP progression as well as regression as needed with any variation of Sx.  She will follow up with Dr Tonita Cong and email with any questions.    PT Treatment/Interventions ADLs/Self Care Home Management;Cryotherapy;Electrical Stimulation;Moist Heat;Traction;Ultrasound;DME Instruction;Gait training;Stair training;Functional mobility training;Therapeutic activities;Therapeutic exercise;Neuromuscular re-education;Patient/family education;Manual techniques;Manual lymph drainage;Iontophoresis 40m/ml Dexamethasone;Passive range of motion;Dry needling;Taping    PT Next Visit Plan DC today             Patient will benefit from skilled therapeutic intervention in order to improve the following deficits and impairments:  Decreased mobility, Difficulty walking, Pain, Decreased strength, Decreased activity tolerance  Visit Diagnosis: Abnormal posture  Other abnormalities of gait and mobility  Unsteadiness on feet  Chronic pain of right knee  Difficulty in walking, not elsewhere classified  Muscle weakness (generalized)     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   Follow-up examination  after eye surgery 08/03/2019   Posterior capsular opacification non visually significant of right eye 08/03/2019   Right epiretinal membrane 08/03/2019   Pseudophakia of both eyes 08/03/2019   Left epiretinal membrane 08/03/2019   Hypothyroidism 08/28/2011   Menopausal symptoms 08/28/2011   Decreased libido 08/28/2011   Hypercholesterolemia 08/28/2011   HSV-2 (herpes simplex virus  2) infection 08/28/2011   CIN III with severe dysplasia 08/28/2011   PHYSICAL THERAPY DISCHARGE SUMMARY  Visits from Start of Care: 12/15/20  Current functional level related to goals / functional outcomes: Most goals met or partially met. Pt indep with all ADL's and functional mobility.  Ambulates without AD.   Remaining deficits: Some Limited activity due to Left knee pain. Unable to return to hiking.   Education / Equipment: HEP. Management of dysfunction   Patient agrees to discharge. Patient goals were partially met. Patient is being discharged due to maximized rehab potential.   Erica Kidney Tharon Aquas) Erica Chase MPT 02/22/2021, 7:09 PM  [facility] [place], [place], [address] Phone: [phone]   Fax:  [phone]  Name: Erica Chase MRN: [id number] Date of Birth: 1945/04/04

## 2021-03-02 DIAGNOSIS — Z23 Encounter for immunization: Secondary | ICD-10-CM | POA: Diagnosis not present

## 2021-03-02 DIAGNOSIS — E785 Hyperlipidemia, unspecified: Secondary | ICD-10-CM | POA: Diagnosis not present

## 2021-03-09 DIAGNOSIS — E039 Hypothyroidism, unspecified: Secondary | ICD-10-CM | POA: Diagnosis not present

## 2021-03-09 DIAGNOSIS — E782 Mixed hyperlipidemia: Secondary | ICD-10-CM | POA: Diagnosis not present

## 2021-03-09 DIAGNOSIS — Z1211 Encounter for screening for malignant neoplasm of colon: Secondary | ICD-10-CM | POA: Diagnosis not present

## 2021-03-10 DIAGNOSIS — J029 Acute pharyngitis, unspecified: Secondary | ICD-10-CM | POA: Diagnosis not present

## 2021-03-10 DIAGNOSIS — J019 Acute sinusitis, unspecified: Secondary | ICD-10-CM | POA: Diagnosis not present

## 2021-03-10 DIAGNOSIS — J45909 Unspecified asthma, uncomplicated: Secondary | ICD-10-CM | POA: Diagnosis not present

## 2021-03-10 DIAGNOSIS — J309 Allergic rhinitis, unspecified: Secondary | ICD-10-CM | POA: Diagnosis not present

## 2021-03-10 DIAGNOSIS — R0981 Nasal congestion: Secondary | ICD-10-CM | POA: Diagnosis not present

## 2021-03-10 DIAGNOSIS — R5383 Other fatigue: Secondary | ICD-10-CM | POA: Diagnosis not present

## 2021-03-10 DIAGNOSIS — Z1152 Encounter for screening for COVID-19: Secondary | ICD-10-CM | POA: Diagnosis not present

## 2021-04-04 DIAGNOSIS — Z85828 Personal history of other malignant neoplasm of skin: Secondary | ICD-10-CM | POA: Diagnosis not present

## 2021-04-04 DIAGNOSIS — L570 Actinic keratosis: Secondary | ICD-10-CM | POA: Diagnosis not present

## 2021-04-04 DIAGNOSIS — Z08 Encounter for follow-up examination after completed treatment for malignant neoplasm: Secondary | ICD-10-CM | POA: Diagnosis not present

## 2021-04-04 DIAGNOSIS — L218 Other seborrheic dermatitis: Secondary | ICD-10-CM | POA: Diagnosis not present

## 2021-04-04 DIAGNOSIS — L821 Other seborrheic keratosis: Secondary | ICD-10-CM | POA: Diagnosis not present

## 2021-04-04 DIAGNOSIS — D225 Melanocytic nevi of trunk: Secondary | ICD-10-CM | POA: Diagnosis not present

## 2021-04-04 DIAGNOSIS — D485 Neoplasm of uncertain behavior of skin: Secondary | ICD-10-CM | POA: Diagnosis not present

## 2021-04-04 DIAGNOSIS — I788 Other diseases of capillaries: Secondary | ICD-10-CM | POA: Diagnosis not present

## 2021-04-04 DIAGNOSIS — L814 Other melanin hyperpigmentation: Secondary | ICD-10-CM | POA: Diagnosis not present

## 2021-04-24 DIAGNOSIS — Z1212 Encounter for screening for malignant neoplasm of rectum: Secondary | ICD-10-CM | POA: Diagnosis not present

## 2021-04-24 DIAGNOSIS — Z1211 Encounter for screening for malignant neoplasm of colon: Secondary | ICD-10-CM | POA: Diagnosis not present

## 2021-05-01 LAB — COLOGUARD: COLOGUARD: POSITIVE — AB

## 2021-06-02 DIAGNOSIS — Z1211 Encounter for screening for malignant neoplasm of colon: Secondary | ICD-10-CM | POA: Diagnosis not present

## 2021-06-02 DIAGNOSIS — K5730 Diverticulosis of large intestine without perforation or abscess without bleeding: Secondary | ICD-10-CM | POA: Diagnosis not present

## 2021-06-02 DIAGNOSIS — R195 Other fecal abnormalities: Secondary | ICD-10-CM | POA: Diagnosis not present

## 2021-07-17 DIAGNOSIS — Z1152 Encounter for screening for COVID-19: Secondary | ICD-10-CM | POA: Diagnosis not present

## 2021-07-17 DIAGNOSIS — J45909 Unspecified asthma, uncomplicated: Secondary | ICD-10-CM | POA: Diagnosis not present

## 2021-07-17 DIAGNOSIS — R051 Acute cough: Secondary | ICD-10-CM | POA: Diagnosis not present

## 2021-07-17 DIAGNOSIS — J029 Acute pharyngitis, unspecified: Secondary | ICD-10-CM | POA: Diagnosis not present

## 2021-07-17 DIAGNOSIS — J309 Allergic rhinitis, unspecified: Secondary | ICD-10-CM | POA: Diagnosis not present

## 2021-10-04 DIAGNOSIS — M7989 Other specified soft tissue disorders: Secondary | ICD-10-CM | POA: Diagnosis not present

## 2021-10-04 DIAGNOSIS — M79605 Pain in left leg: Secondary | ICD-10-CM | POA: Diagnosis not present

## 2021-10-04 DIAGNOSIS — E039 Hypothyroidism, unspecified: Secondary | ICD-10-CM | POA: Diagnosis not present

## 2021-10-04 DIAGNOSIS — R945 Abnormal results of liver function studies: Secondary | ICD-10-CM | POA: Diagnosis not present

## 2021-10-04 DIAGNOSIS — M25562 Pain in left knee: Secondary | ICD-10-CM | POA: Diagnosis not present

## 2021-10-05 ENCOUNTER — Other Ambulatory Visit (HOSPITAL_COMMUNITY): Payer: Self-pay | Admitting: Adult Health

## 2021-10-05 ENCOUNTER — Ambulatory Visit (HOSPITAL_COMMUNITY)
Admission: RE | Admit: 2021-10-05 | Discharge: 2021-10-05 | Disposition: A | Discharge status: Home / Self Care | Payer: Medicare PPO | Source: Ambulatory Visit | Attending: Adult Health | Admitting: Adult Health

## 2021-10-05 DIAGNOSIS — M7989 Other specified soft tissue disorders: Secondary | ICD-10-CM | POA: Diagnosis not present

## 2021-10-05 DIAGNOSIS — M79605 Pain in left leg: Secondary | ICD-10-CM | POA: Diagnosis not present

## 2021-10-05 NOTE — Progress Notes (Signed)
Left lower extremity venous duplex has been completed. Preliminary results can be found in CV Proc through chart review.  Results were faxed to Ronney Lion NP.  10/05/21 4:21 PM Olen Cordial RVT

## 2021-10-19 DIAGNOSIS — G5602 Carpal tunnel syndrome, left upper limb: Secondary | ICD-10-CM | POA: Diagnosis not present

## 2021-10-25 DIAGNOSIS — G5602 Carpal tunnel syndrome, left upper limb: Secondary | ICD-10-CM | POA: Diagnosis not present

## 2021-10-26 DIAGNOSIS — G5602 Carpal tunnel syndrome, left upper limb: Secondary | ICD-10-CM | POA: Diagnosis not present

## 2021-10-31 DIAGNOSIS — R29898 Other symptoms and signs involving the musculoskeletal system: Secondary | ICD-10-CM | POA: Diagnosis not present

## 2021-11-02 DIAGNOSIS — R29898 Other symptoms and signs involving the musculoskeletal system: Secondary | ICD-10-CM | POA: Diagnosis not present

## 2021-11-03 DIAGNOSIS — H35372 Puckering of macula, left eye: Secondary | ICD-10-CM | POA: Diagnosis not present

## 2021-11-03 DIAGNOSIS — H52203 Unspecified astigmatism, bilateral: Secondary | ICD-10-CM | POA: Diagnosis not present

## 2021-11-03 DIAGNOSIS — Z961 Presence of intraocular lens: Secondary | ICD-10-CM | POA: Diagnosis not present

## 2021-11-06 DIAGNOSIS — R29898 Other symptoms and signs involving the musculoskeletal system: Secondary | ICD-10-CM | POA: Diagnosis not present

## 2021-11-08 DIAGNOSIS — R29898 Other symptoms and signs involving the musculoskeletal system: Secondary | ICD-10-CM | POA: Diagnosis not present

## 2021-11-16 DIAGNOSIS — L814 Other melanin hyperpigmentation: Secondary | ICD-10-CM | POA: Diagnosis not present

## 2021-11-16 DIAGNOSIS — L821 Other seborrheic keratosis: Secondary | ICD-10-CM | POA: Diagnosis not present

## 2021-11-16 DIAGNOSIS — Z85828 Personal history of other malignant neoplasm of skin: Secondary | ICD-10-CM | POA: Diagnosis not present

## 2021-11-16 DIAGNOSIS — R29898 Other symptoms and signs involving the musculoskeletal system: Secondary | ICD-10-CM | POA: Diagnosis not present

## 2021-11-16 DIAGNOSIS — D1801 Hemangioma of skin and subcutaneous tissue: Secondary | ICD-10-CM | POA: Diagnosis not present

## 2021-11-16 DIAGNOSIS — Z08 Encounter for follow-up examination after completed treatment for malignant neoplasm: Secondary | ICD-10-CM | POA: Diagnosis not present

## 2021-11-20 DIAGNOSIS — R29898 Other symptoms and signs involving the musculoskeletal system: Secondary | ICD-10-CM | POA: Diagnosis not present

## 2021-11-22 DIAGNOSIS — R29898 Other symptoms and signs involving the musculoskeletal system: Secondary | ICD-10-CM | POA: Diagnosis not present

## 2021-11-23 DIAGNOSIS — G5602 Carpal tunnel syndrome, left upper limb: Secondary | ICD-10-CM | POA: Diagnosis not present

## 2021-11-29 DIAGNOSIS — R29898 Other symptoms and signs involving the musculoskeletal system: Secondary | ICD-10-CM | POA: Diagnosis not present

## 2021-12-06 DIAGNOSIS — L309 Dermatitis, unspecified: Secondary | ICD-10-CM | POA: Diagnosis not present

## 2021-12-06 DIAGNOSIS — L299 Pruritus, unspecified: Secondary | ICD-10-CM | POA: Diagnosis not present

## 2021-12-07 DIAGNOSIS — R29898 Other symptoms and signs involving the musculoskeletal system: Secondary | ICD-10-CM | POA: Diagnosis not present

## 2021-12-11 DIAGNOSIS — N952 Postmenopausal atrophic vaginitis: Secondary | ICD-10-CM | POA: Diagnosis not present

## 2021-12-11 DIAGNOSIS — B009 Herpesviral infection, unspecified: Secondary | ICD-10-CM | POA: Diagnosis not present

## 2021-12-22 DIAGNOSIS — M25832 Other specified joint disorders, left wrist: Secondary | ICD-10-CM | POA: Diagnosis not present

## 2021-12-22 DIAGNOSIS — G5602 Carpal tunnel syndrome, left upper limb: Secondary | ICD-10-CM | POA: Diagnosis not present

## 2021-12-25 DIAGNOSIS — M25532 Pain in left wrist: Secondary | ICD-10-CM | POA: Diagnosis not present

## 2021-12-27 DIAGNOSIS — M25532 Pain in left wrist: Secondary | ICD-10-CM | POA: Diagnosis not present

## 2021-12-27 DIAGNOSIS — M25832 Other specified joint disorders, left wrist: Secondary | ICD-10-CM | POA: Diagnosis not present

## 2021-12-29 DIAGNOSIS — L820 Inflamed seborrheic keratosis: Secondary | ICD-10-CM | POA: Diagnosis not present

## 2021-12-29 DIAGNOSIS — L570 Actinic keratosis: Secondary | ICD-10-CM | POA: Diagnosis not present

## 2022-01-18 DIAGNOSIS — E785 Hyperlipidemia, unspecified: Secondary | ICD-10-CM | POA: Diagnosis not present

## 2022-01-18 DIAGNOSIS — E039 Hypothyroidism, unspecified: Secondary | ICD-10-CM | POA: Diagnosis not present

## 2022-01-18 DIAGNOSIS — R7989 Other specified abnormal findings of blood chemistry: Secondary | ICD-10-CM | POA: Diagnosis not present

## 2022-01-26 DIAGNOSIS — I77810 Thoracic aortic ectasia: Secondary | ICD-10-CM | POA: Diagnosis not present

## 2022-01-26 DIAGNOSIS — Z1339 Encounter for screening examination for other mental health and behavioral disorders: Secondary | ICD-10-CM | POA: Diagnosis not present

## 2022-01-26 DIAGNOSIS — E039 Hypothyroidism, unspecified: Secondary | ICD-10-CM | POA: Diagnosis not present

## 2022-01-26 DIAGNOSIS — Z1231 Encounter for screening mammogram for malignant neoplasm of breast: Secondary | ICD-10-CM | POA: Diagnosis not present

## 2022-01-26 DIAGNOSIS — Z0000 Encounter for general adult medical examination without abnormal findings: Secondary | ICD-10-CM | POA: Diagnosis not present

## 2022-01-26 DIAGNOSIS — I700 Atherosclerosis of aorta: Secondary | ICD-10-CM | POA: Diagnosis not present

## 2022-01-26 DIAGNOSIS — E785 Hyperlipidemia, unspecified: Secondary | ICD-10-CM | POA: Diagnosis not present

## 2022-01-26 DIAGNOSIS — R82998 Other abnormal findings in urine: Secondary | ICD-10-CM | POA: Diagnosis not present

## 2022-01-26 DIAGNOSIS — R918 Other nonspecific abnormal finding of lung field: Secondary | ICD-10-CM | POA: Diagnosis not present

## 2022-01-26 DIAGNOSIS — Z1331 Encounter for screening for depression: Secondary | ICD-10-CM | POA: Diagnosis not present

## 2022-01-26 DIAGNOSIS — G459 Transient cerebral ischemic attack, unspecified: Secondary | ICD-10-CM | POA: Diagnosis not present

## 2022-01-26 DIAGNOSIS — R945 Abnormal results of liver function studies: Secondary | ICD-10-CM | POA: Diagnosis not present

## 2022-01-26 DIAGNOSIS — R011 Cardiac murmur, unspecified: Secondary | ICD-10-CM | POA: Diagnosis not present

## 2022-03-21 ENCOUNTER — Other Ambulatory Visit: Payer: Self-pay | Admitting: Nurse Practitioner

## 2022-03-21 DIAGNOSIS — R748 Abnormal levels of other serum enzymes: Secondary | ICD-10-CM

## 2022-04-17 ENCOUNTER — Ambulatory Visit
Admission: RE | Admit: 2022-04-17 | Discharge: 2022-04-17 | Disposition: A | Discharge status: Home / Self Care | Payer: Medicare PPO | Source: Ambulatory Visit | Attending: Nurse Practitioner | Admitting: Nurse Practitioner

## 2022-04-17 DIAGNOSIS — R748 Abnormal levels of other serum enzymes: Secondary | ICD-10-CM | POA: Diagnosis not present

## 2022-04-17 DIAGNOSIS — Z9049 Acquired absence of other specified parts of digestive tract: Secondary | ICD-10-CM | POA: Diagnosis not present

## 2022-05-28 DIAGNOSIS — R748 Abnormal levels of other serum enzymes: Secondary | ICD-10-CM | POA: Diagnosis not present

## 2022-06-25 DIAGNOSIS — R7301 Impaired fasting glucose: Secondary | ICD-10-CM | POA: Diagnosis not present

## 2022-08-15 DIAGNOSIS — J309 Allergic rhinitis, unspecified: Secondary | ICD-10-CM | POA: Diagnosis not present

## 2022-08-15 DIAGNOSIS — J45909 Unspecified asthma, uncomplicated: Secondary | ICD-10-CM | POA: Diagnosis not present

## 2022-08-15 DIAGNOSIS — J0100 Acute maxillary sinusitis, unspecified: Secondary | ICD-10-CM | POA: Diagnosis not present

## 2022-08-21 DIAGNOSIS — L814 Other melanin hyperpigmentation: Secondary | ICD-10-CM | POA: Diagnosis not present

## 2022-08-21 DIAGNOSIS — D485 Neoplasm of uncertain behavior of skin: Secondary | ICD-10-CM | POA: Diagnosis not present

## 2022-08-21 DIAGNOSIS — L821 Other seborrheic keratosis: Secondary | ICD-10-CM | POA: Diagnosis not present

## 2022-08-21 DIAGNOSIS — L570 Actinic keratosis: Secondary | ICD-10-CM | POA: Diagnosis not present

## 2022-08-21 DIAGNOSIS — L578 Other skin changes due to chronic exposure to nonionizing radiation: Secondary | ICD-10-CM | POA: Diagnosis not present

## 2022-08-21 DIAGNOSIS — Z411 Encounter for cosmetic surgery: Secondary | ICD-10-CM | POA: Diagnosis not present

## 2022-08-22 DIAGNOSIS — R112 Nausea with vomiting, unspecified: Secondary | ICD-10-CM | POA: Diagnosis not present

## 2022-08-22 DIAGNOSIS — R051 Acute cough: Secondary | ICD-10-CM | POA: Diagnosis not present

## 2022-08-22 DIAGNOSIS — R519 Headache, unspecified: Secondary | ICD-10-CM | POA: Diagnosis not present

## 2022-08-22 DIAGNOSIS — G43109 Migraine with aura, not intractable, without status migrainosus: Secondary | ICD-10-CM | POA: Diagnosis not present

## 2022-08-28 DIAGNOSIS — K5730 Diverticulosis of large intestine without perforation or abscess without bleeding: Secondary | ICD-10-CM | POA: Diagnosis not present

## 2022-08-28 DIAGNOSIS — R140 Abdominal distension (gaseous): Secondary | ICD-10-CM | POA: Diagnosis not present

## 2022-08-28 DIAGNOSIS — K5900 Constipation, unspecified: Secondary | ICD-10-CM | POA: Diagnosis not present

## 2022-08-28 DIAGNOSIS — E039 Hypothyroidism, unspecified: Secondary | ICD-10-CM | POA: Diagnosis not present

## 2022-09-27 DIAGNOSIS — K760 Fatty (change of) liver, not elsewhere classified: Secondary | ICD-10-CM | POA: Diagnosis not present

## 2022-09-28 DIAGNOSIS — R011 Cardiac murmur, unspecified: Secondary | ICD-10-CM | POA: Diagnosis not present

## 2022-09-28 DIAGNOSIS — I77810 Thoracic aortic ectasia: Secondary | ICD-10-CM | POA: Diagnosis not present

## 2022-09-28 DIAGNOSIS — I700 Atherosclerosis of aorta: Secondary | ICD-10-CM | POA: Diagnosis not present

## 2022-09-28 DIAGNOSIS — F39 Unspecified mood [affective] disorder: Secondary | ICD-10-CM | POA: Diagnosis not present

## 2022-09-28 DIAGNOSIS — E039 Hypothyroidism, unspecified: Secondary | ICD-10-CM | POA: Diagnosis not present

## 2022-09-28 DIAGNOSIS — E785 Hyperlipidemia, unspecified: Secondary | ICD-10-CM | POA: Diagnosis not present

## 2022-09-28 DIAGNOSIS — R5383 Other fatigue: Secondary | ICD-10-CM | POA: Diagnosis not present

## 2022-09-28 DIAGNOSIS — G479 Sleep disorder, unspecified: Secondary | ICD-10-CM | POA: Diagnosis not present

## 2022-09-28 DIAGNOSIS — F900 Attention-deficit hyperactivity disorder, predominantly inattentive type: Secondary | ICD-10-CM | POA: Diagnosis not present

## 2022-11-07 DIAGNOSIS — Z961 Presence of intraocular lens: Secondary | ICD-10-CM | POA: Diagnosis not present

## 2022-11-07 DIAGNOSIS — H52203 Unspecified astigmatism, bilateral: Secondary | ICD-10-CM | POA: Diagnosis not present

## 2022-11-07 DIAGNOSIS — H35373 Puckering of macula, bilateral: Secondary | ICD-10-CM | POA: Diagnosis not present

## 2023-02-06 DIAGNOSIS — L820 Inflamed seborrheic keratosis: Secondary | ICD-10-CM | POA: Diagnosis not present

## 2023-02-06 DIAGNOSIS — L570 Actinic keratosis: Secondary | ICD-10-CM | POA: Diagnosis not present

## 2023-02-06 DIAGNOSIS — L720 Epidermal cyst: Secondary | ICD-10-CM | POA: Diagnosis not present

## 2023-02-08 DIAGNOSIS — Z1231 Encounter for screening mammogram for malignant neoplasm of breast: Secondary | ICD-10-CM | POA: Diagnosis not present

## 2023-02-26 DIAGNOSIS — S93421A Sprain of deltoid ligament of right ankle, initial encounter: Secondary | ICD-10-CM | POA: Diagnosis not present

## 2023-04-30 DIAGNOSIS — E785 Hyperlipidemia, unspecified: Secondary | ICD-10-CM | POA: Diagnosis not present

## 2023-04-30 DIAGNOSIS — E039 Hypothyroidism, unspecified: Secondary | ICD-10-CM | POA: Diagnosis not present

## 2023-05-06 DIAGNOSIS — R82998 Other abnormal findings in urine: Secondary | ICD-10-CM | POA: Diagnosis not present

## 2023-05-06 DIAGNOSIS — I351 Nonrheumatic aortic (valve) insufficiency: Secondary | ICD-10-CM | POA: Diagnosis not present

## 2023-05-06 DIAGNOSIS — Z8673 Personal history of transient ischemic attack (TIA), and cerebral infarction without residual deficits: Secondary | ICD-10-CM | POA: Diagnosis not present

## 2023-05-06 DIAGNOSIS — Z0000 Encounter for general adult medical examination without abnormal findings: Secondary | ICD-10-CM | POA: Diagnosis not present

## 2023-05-06 DIAGNOSIS — K649 Unspecified hemorrhoids: Secondary | ICD-10-CM | POA: Diagnosis not present

## 2023-05-06 DIAGNOSIS — E039 Hypothyroidism, unspecified: Secondary | ICD-10-CM | POA: Diagnosis not present

## 2023-05-06 DIAGNOSIS — E785 Hyperlipidemia, unspecified: Secondary | ICD-10-CM | POA: Diagnosis not present

## 2023-05-06 DIAGNOSIS — I700 Atherosclerosis of aorta: Secondary | ICD-10-CM | POA: Diagnosis not present

## 2023-05-06 DIAGNOSIS — I5189 Other ill-defined heart diseases: Secondary | ICD-10-CM | POA: Diagnosis not present

## 2023-05-09 DIAGNOSIS — J069 Acute upper respiratory infection, unspecified: Secondary | ICD-10-CM | POA: Diagnosis not present

## 2023-05-24 DIAGNOSIS — J019 Acute sinusitis, unspecified: Secondary | ICD-10-CM | POA: Diagnosis not present

## 2023-05-24 DIAGNOSIS — R030 Elevated blood-pressure reading, without diagnosis of hypertension: Secondary | ICD-10-CM | POA: Diagnosis not present

## 2023-05-24 DIAGNOSIS — H938X3 Other specified disorders of ear, bilateral: Secondary | ICD-10-CM | POA: Diagnosis not present

## 2023-05-24 DIAGNOSIS — G459 Transient cerebral ischemic attack, unspecified: Secondary | ICD-10-CM | POA: Diagnosis not present

## 2023-09-24 DIAGNOSIS — M25561 Pain in right knee: Secondary | ICD-10-CM | POA: Diagnosis not present

## 2023-09-24 DIAGNOSIS — M25562 Pain in left knee: Secondary | ICD-10-CM | POA: Diagnosis not present

## 2023-10-31 DIAGNOSIS — M25562 Pain in left knee: Secondary | ICD-10-CM | POA: Diagnosis not present

## 2023-10-31 DIAGNOSIS — M25561 Pain in right knee: Secondary | ICD-10-CM | POA: Diagnosis not present

## 2023-11-06 ENCOUNTER — Encounter: Payer: Self-pay | Admitting: Cardiovascular Disease

## 2023-11-06 NOTE — Progress Notes (Unsigned)
 No chief complaint on file.  History of Present Illness: 78 yo female with history of asthma, HLD, hypothyroidism, CVA who is here today to re-establish care. She was seen in our office in 2021 for the evaluation of dyspnea and was found to have aortic valve insufficiency. Echo June 2022 with normal LV systolic function. Moderate aortic insufficiency. Mild mitral regurgitation. Coronary CTA  June 2021 with no evidence of CAD, calcium  score zero.   She denies any chest pain, dyspnea, palpitations, lower extremity edema, orthopnea, PND, dizziness, near syncope or syncope.   Primary Care Physician: Shayne Anes, MD   Past Medical History:  Diagnosis Date   Abdominal pain 2004   ADD (attention deficit disorder) 08/2010   Allergic rhinitis 01/2011   Asthma    unsure-,reflux ruled out using nasonex and pulmocort   Atypical endocervical cells on Pap smear 1999   CIN III (cervical intraepithelial neoplasia grade III) with severe dysplasia 2001   FHx: cancer    HSV infection 1997   Hyperlipidemia 2018   Hypothyroidism    Libido, decreased 2011   Menopausal symptoms 2010   Orgasmic dysfunction 2006   Ovarian cyst    Pelvic relaxation 12/2000   PONV (postoperative nausea and vomiting)    scop patch has worked well in past   Sinusitis 01/2011   Stroke [facility])     Past Surgical History:  Procedure Laterality Date   CARPAL TUNNEL RELEASE     CATARACT EXTRACTION Left 2015   CHOLECYSTECTOMY  01/2011   KNEE ARTHROSCOPY     KNEE ARTHROSCOPY  03/05/2011   Procedure: ARTHROSCOPY KNEE;  Surgeon: Reyes JAYSON Billing;  [facility];  Service: Orthopedics;  Laterality: Left;  left knee scope with debridement , Partial lateral meniscectomy   ROTATOR CUFF REPAIR     THUMB ARTHROSCOPY     thumb surgery      Current Outpatient Medications  Medication Sig Dispense Refill   acyclovir  ointment (ZOVIRAX ) 5 % Apply 1 application topically as needed.     aspirin  EC 81 MG tablet  Take 1 tablet (81 mg total) by mouth daily. 1 tablet 0   Cyanocobalamin 5000 MCG TBDP Take 5 mLs by mouth.     Evolocumab (REPATHA) 140 MG/ML SOSY Inject into the skin.     fluticasone (FLONASE) 50 MCG/ACT nasal spray Place into both nostrils as needed for allergies or rhinitis.     levothyroxine (SYNTHROID) 50 MCG tablet Take 50 mcg by mouth daily before breakfast.     Multiple Vitamins-Calcium  (ONE-A-DAY WOMENS PO) Take by mouth.     OMEGA-3 FATTY ACIDS PO Take by mouth.      traZODone  (DESYREL ) 50 MG tablet TAKE 1/2 TO 1 TABLET BY MOUTH AT BEDTIME 90 tablet 1   Ubiquinol 100 MG CAPS Take by mouth.     UNABLE TO FIND Tumeric takes as needed     Zinc Chelated 22.5 MG TABS Take by mouth.     No current facility-administered medications for this visit.    Allergies  Allergen Reactions   Clarithromycin     rash   Penicillins     Social History   Socioeconomic History   Marital status: Divorced    Spouse name: Not on file   Number of children: 3   Years of education: Not on file   Highest education level: Not on file  Occupational History   Occupation: Retired Runner, broadcasting/film/video, Active massage therapy  Tobacco Use   Smoking status:  Former    Current packs/day: 0.00    Types: Cigarettes    Quit date: 02/28/1969    Years since quitting: 54.7   Smokeless tobacco: Never  Substance and Sexual Activity   Alcohol  use: Yes    Comment: social vodka   Drug use: No   Sexual activity: Never  Other Topics Concern   Not on file  Social History Narrative   Not on file   Social Drivers of Health   Financial Resource Strain: Not on file  Food Insecurity: Low Risk  (03/21/2022)   Received from [facility]   Hunger Vital Sign    Worried About Running Out of Food in the Last Year: Never true    Ran Out of Food in the Last Year: Never true  Transportation Needs: Not on file (03/21/2022)  Physical Activity: Not on file  Stress: Not on file  Social Connections: Not on file  Intimate  Partner Violence: Not on file    Family History  Problem Relation Age of Onset   Hearing loss Brother    Chronic Renal Failure Father    Heart disease Father    Arthritis Father     Review of Systems:  As stated in the HPI and otherwise negative.   There were no vitals taken for this visit.  Physical Examination: General: Well developed, well nourished, NAD  HEENT: OP clear, mucus membranes moist  SKIN: warm, dry. No rashes. Neuro: No focal deficits  Musculoskeletal: Muscle strength 5/5 all ext  Psychiatric: Mood and affect normal  Neck: No JVD, no carotid bruits, no thyromegaly, no lymphadenopathy.  Lungs:Clear bilaterally, no wheezes, rhonci, crackles Cardiovascular: Regular rate and rhythm. No murmurs, gallops or rubs. Abdomen:Soft. Bowel sounds present. Non-tender.  Extremities: No lower extremity edema. Pulses are 2 + in the bilateral DP/PT.  EKG:  EKG {ACTION; IS/IS [id number]} ordered today. The ekg ordered today demonstrates ***  Recent Labs: No results found for requested labs within last 365 days.   Lipid Panel    Component Value Date/Time   CHOL 197 07/22/2017 0911   TRIG 106 07/22/2017 0911   HDL 55 07/22/2017 0911   CHOLHDL 3.6 07/22/2017 0911   CHOLHDL 3.2 07/30/2010 0421   VLDL 12 07/30/2010 0421   LDLCALC 121 (H) 07/22/2017 0911     Wt Readings from Last 3 Encounters:  01/04/20 140 lb (63.5 kg)  11/04/19 141 lb (64 kg)  09/07/19 143 lb (64.9 kg)     Assessment and Plan:   1. Aortic valve insufficiency: *** Will arrange an echo to assess.   Labs/ tests ordered today include:  No orders of the defined types were placed in this encounter.  Disposition:   F/U with me in one year   Signed, Lonni Cash, MD, [facility] 11/06/2023 1:21 PM    [facility] [address] [place], [place], [place]  [address] Phone: [phone]; Fax: [phone]

## 2023-11-07 ENCOUNTER — Ambulatory Visit: Attending: Cardiovascular Disease | Admitting: Cardiovascular Disease

## 2023-11-07 ENCOUNTER — Encounter: Payer: Self-pay | Admitting: Cardiovascular Disease

## 2023-11-07 VITALS — BP 178/74 | HR 53 | Resp 16 | Ht 63.0 in | Wt 143.4 lb

## 2023-11-07 DIAGNOSIS — I351 Nonrheumatic aortic (valve) insufficiency: Secondary | ICD-10-CM

## 2023-11-07 DIAGNOSIS — I10 Essential (primary) hypertension: Secondary | ICD-10-CM | POA: Diagnosis not present

## 2023-11-07 MED ORDER — LOSARTAN POTASSIUM 25 MG PO TABS: 25.0000 mg | ORAL_TABLET | Freq: Every day | ORAL | 3 refills | Status: AC

## 2023-11-07 NOTE — Patient Instructions (Signed)
 Medication Instructions:  Your physician has recommended you make the following change in your medication:  1.) start losartan  25 mg - one tablet daily  *If you need a refill on your cardiac medications before your next appointment, please call your pharmacy*  Lab Work: Return in 7-10 days for blood work (bmet)  If you have labs (blood work) drawn today and your tests are completely normal, you will receive your results only by: [employer] (if you have MyChart) OR A paper copy in the mail If you have any lab test that is abnormal or we need to change your treatment, we will call you to review the results.  Testing/Procedures: Your physician has requested that you have an echocardiogram. Echocardiography is a painless test that uses sound waves to create images of your heart. It provides your doctor with information about the size and shape of your heart and how well your heart's chambers and valves are working. This procedure takes approximately one hour. There are no restrictions for this procedure. Please do NOT wear cologne, perfume, aftershave, or lotions (deodorant is allowed). Please arrive 15 minutes prior to your appointment time.  Please note: We ask at that you not bring children with you during ultrasound (echo/ vascular) testing. Due to room size and safety concerns, children are not allowed in the ultrasound rooms during exams. Our front office staff cannot provide observation of children in our lobby area while testing is being conducted. An adult accompanying a patient to their appointment will only be allowed in the ultrasound room at the discretion of the ultrasound technician under special circumstances. We apologize for any inconvenience.   Follow-Up: At [facility], you and your health needs are our priority.  As part of our continuing mission to provide you with exceptional heart care, our providers are all part of one team.  This team includes your primary  Cardiologist (physician) and Advanced Practice Providers or APPs (Physician Assistants and Nurse Practitioners) who all work together to provide you with the care you need, when you need it.  Your next appointment:   12 month(s)  Provider:   Lonni Cash, MD

## 2023-11-12 DIAGNOSIS — M25561 Pain in right knee: Secondary | ICD-10-CM | POA: Diagnosis not present

## 2023-11-12 DIAGNOSIS — M25562 Pain in left knee: Secondary | ICD-10-CM | POA: Diagnosis not present

## 2023-11-26 ENCOUNTER — Ambulatory Visit: Payer: Self-pay | Admitting: Cardiovascular Disease

## 2023-11-26 ENCOUNTER — Ambulatory Visit (HOSPITAL_COMMUNITY)
Admission: RE | Admit: 2023-11-26 | Discharge: 2023-11-26 | Disposition: A | Discharge status: Home / Self Care | Source: Ambulatory Visit | Attending: Vascular Surgery | Admitting: Vascular Surgery

## 2023-11-26 ENCOUNTER — Encounter: Payer: Self-pay | Admitting: Cardiovascular Disease

## 2023-11-26 DIAGNOSIS — I351 Nonrheumatic aortic (valve) insufficiency: Secondary | ICD-10-CM | POA: Insufficient documentation

## 2023-11-26 LAB — ECHOCARDIOGRAM COMPLETE
AR max vel: 1.34 cm2
AV Area VTI: 1.58 cm2
AV Area mean vel: 1.42 cm2
AV Mean grad: 5 mmHg
AV Peak grad: 12.4 mmHg
Ao pk vel: 1.76 m/s
Area-P 1/2: 2.96 cm2
S' Lateral: 3.35 cm

## 2023-12-04 DIAGNOSIS — M25562 Pain in left knee: Secondary | ICD-10-CM | POA: Diagnosis not present

## 2023-12-04 DIAGNOSIS — M25561 Pain in right knee: Secondary | ICD-10-CM | POA: Diagnosis not present

## 2023-12-04 DIAGNOSIS — M170 Bilateral primary osteoarthritis of knee: Secondary | ICD-10-CM | POA: Diagnosis not present

## 2023-12-11 ENCOUNTER — Other Ambulatory Visit (HOSPITAL_COMMUNITY)

## 2023-12-13 ENCOUNTER — Other Ambulatory Visit (HOSPITAL_COMMUNITY)

## 2023-12-16 DIAGNOSIS — J45909 Unspecified asthma, uncomplicated: Secondary | ICD-10-CM | POA: Diagnosis not present

## 2023-12-16 DIAGNOSIS — E559 Vitamin D deficiency, unspecified: Secondary | ICD-10-CM | POA: Diagnosis not present

## 2023-12-16 DIAGNOSIS — E039 Hypothyroidism, unspecified: Secondary | ICD-10-CM | POA: Diagnosis not present

## 2023-12-16 DIAGNOSIS — Z8673 Personal history of transient ischemic attack (TIA), and cerebral infarction without residual deficits: Secondary | ICD-10-CM | POA: Diagnosis not present

## 2023-12-16 DIAGNOSIS — R7301 Impaired fasting glucose: Secondary | ICD-10-CM | POA: Diagnosis not present

## 2023-12-16 DIAGNOSIS — K649 Unspecified hemorrhoids: Secondary | ICD-10-CM | POA: Diagnosis not present

## 2023-12-16 DIAGNOSIS — I700 Atherosclerosis of aorta: Secondary | ICD-10-CM | POA: Diagnosis not present

## 2023-12-16 DIAGNOSIS — R030 Elevated blood-pressure reading, without diagnosis of hypertension: Secondary | ICD-10-CM | POA: Diagnosis not present

## 2023-12-16 DIAGNOSIS — E785 Hyperlipidemia, unspecified: Secondary | ICD-10-CM | POA: Diagnosis not present

## 2023-12-16 DIAGNOSIS — Z780 Asymptomatic menopausal state: Secondary | ICD-10-CM | POA: Diagnosis not present

## 2023-12-19 DIAGNOSIS — L03031 Cellulitis of right toe: Secondary | ICD-10-CM | POA: Diagnosis not present

## 2023-12-19 DIAGNOSIS — M79674 Pain in right toe(s): Secondary | ICD-10-CM | POA: Diagnosis not present

## 2023-12-19 DIAGNOSIS — B351 Tinea unguium: Secondary | ICD-10-CM | POA: Diagnosis not present

## 2023-12-19 DIAGNOSIS — Z8673 Personal history of transient ischemic attack (TIA), and cerebral infarction without residual deficits: Secondary | ICD-10-CM | POA: Diagnosis not present

## 2023-12-19 DIAGNOSIS — E785 Hyperlipidemia, unspecified: Secondary | ICD-10-CM | POA: Diagnosis not present

## 2023-12-19 DIAGNOSIS — G4733 Obstructive sleep apnea (adult) (pediatric): Secondary | ICD-10-CM | POA: Diagnosis not present

## 2023-12-19 DIAGNOSIS — E663 Overweight: Secondary | ICD-10-CM | POA: Diagnosis not present

## 2023-12-19 DIAGNOSIS — R7301 Impaired fasting glucose: Secondary | ICD-10-CM | POA: Diagnosis not present

## 2023-12-19 DIAGNOSIS — K760 Fatty (change of) liver, not elsewhere classified: Secondary | ICD-10-CM | POA: Diagnosis not present

## 2024-01-03 DIAGNOSIS — B351 Tinea unguium: Secondary | ICD-10-CM | POA: Diagnosis not present

## 2024-01-03 DIAGNOSIS — M79674 Pain in right toe(s): Secondary | ICD-10-CM | POA: Diagnosis not present

## 2024-01-03 DIAGNOSIS — E663 Overweight: Secondary | ICD-10-CM | POA: Diagnosis not present

## 2024-01-08 ENCOUNTER — Encounter: Payer: Self-pay | Admitting: Podiatry

## 2024-01-08 ENCOUNTER — Ambulatory Visit: Admitting: Podiatry

## 2024-01-08 DIAGNOSIS — B351 Tinea unguium: Secondary | ICD-10-CM

## 2024-01-08 NOTE — Progress Notes (Signed)
  Subjective:  Patient ID: Erica Chase, female    DOB: 1946-03-16,   MRN: [id number]  Chief Complaint  Patient presents with   Nail Problem    This right big toe is red and swollen.  I do have a nail fungus.  It's difficult to wear shoes and it's very tender.  I'd like her to check the other big toe too.    78 y.o. female presents for concern of nail fungus on right great toenail. Relates she was seen by PCP and given mupirocin and jublia but never got authorized. She relates pain in the area but has iproved in redness. Currently with nail polish on.  . Denies any other pedal complaints. Denies n/v/f/c.   Past Medical History:  Diagnosis Date   Abdominal pain 2004   ADD (attention deficit disorder) 08/2010   Allergic rhinitis 01/2011   Aortic insufficiency    Asthma    unsure-,reflux ruled out using nasonex and pulmocort   Atypical endocervical cells on Pap smear 1999   CIN III (cervical intraepithelial neoplasia grade III) with severe dysplasia 2001   FHx: cancer    HSV infection 1997   Hyperlipidemia 2018   Hypothyroidism    Libido, decreased 2011   Menopausal symptoms 2010   Orgasmic dysfunction 2006   Ovarian cyst    Pelvic relaxation 12/2000   PONV (postoperative nausea and vomiting)    scop patch has worked well in past   Sinusitis 01/2011   Stroke (HCC)     Objective:  Physical Exam: Vascular: DP/PT pulses 2/4 bilateral. CFT <3 seconds. Normal hair growth on digits. No edema.  Skin. No lacerations or abrasions bilateral feet. Thickened and discoloration noted of right hallux nail with subungual debris. No incurvation noted. Some erythema noted to medial nail fold  Musculoskeletal: MMT 5/5 bilateral lower extremities in DF, PF, Inversion and Eversion. Deceased ROM in DF of ankle joint.  Neurological: Sensation intact to light touch.   Assessment:   1. Onychomycosis      Plan:  Patient was evaluated and treated and all questions answered. -Examined  patient -Discussed treatment options for painful dystrophic nails  -Clinical picture and Fungal culture was obtained by removing a portion of the hard nail itself from each of the involved toenails using a sterile nail nipper and sent to [facility] lab. Patient tolerated the biopsy procedure well without discomfort or need for anesthesia.  -Discussed fungal nail treatment options including oral, topical, and laser treatments.  -Patient to return in 4 weeks for follow up evaluation and discussion of fungal culture results or sooner if symptoms worsen.    Asberry Failing, DPM

## 2024-01-09 NOTE — Addendum Note (Signed)
 Addended by: WAYLAN ELIDIA PARAS on: 01/09/2024 04:54 PM   Modules accepted: Orders

## 2024-01-17 ENCOUNTER — Telehealth: Payer: Self-pay | Admitting: Podiatry

## 2024-01-17 NOTE — Telephone Encounter (Signed)
 Patient would like to know is there anything else she can do for foot, while waiting for labs, great toe on right foot is throbbing. Patient contact number, [phone]

## 2024-01-23 ENCOUNTER — Telehealth: Payer: Self-pay | Admitting: Podiatry

## 2024-01-23 ENCOUNTER — Other Ambulatory Visit: Payer: Self-pay | Admitting: Podiatry

## 2024-01-23 NOTE — Telephone Encounter (Signed)
 As per patient, AVS shows, she had last visit with Dr. Joya on 01/23/24, which is incorrect, also noticed labs were mentioned on AVS and wasn't sure if labs were in, she has an appointment scheduled on 02/05/24, would like to discuss lab results.

## 2024-01-27 ENCOUNTER — Encounter: Payer: Self-pay | Admitting: Podiatry

## 2024-01-27 ENCOUNTER — Ambulatory Visit: Admitting: Podiatry

## 2024-01-27 DIAGNOSIS — I10 Essential (primary) hypertension: Secondary | ICD-10-CM | POA: Diagnosis not present

## 2024-01-27 DIAGNOSIS — B351 Tinea unguium: Secondary | ICD-10-CM

## 2024-01-27 MED ORDER — CICLOPIROX 8 % EX SOLN: Freq: Every day | CUTANEOUS | 0 refills | Status: AC

## 2024-01-27 NOTE — Progress Notes (Signed)
  Subjective:  Patient ID: Erica Chase, female    DOB: 1946/01/14,   MRN: [id number]  Chief Complaint  Patient presents with   Nail Problem    This is a follow-up for lab results.  I want to know if my left big toenail is ingrown?    78 y.o. female presents for follow-up of fungal nail and to discuss culture results.  . Denies any other pedal complaints. Denies n/v/f/c.   Past Medical History:  Diagnosis Date   Abdominal pain 2004   ADD (attention deficit disorder) 08/2010   Allergic rhinitis 01/2011   Aortic insufficiency    Asthma    unsure-,reflux ruled out using nasonex and pulmocort   Atypical endocervical cells on Pap smear 1999   CIN III (cervical intraepithelial neoplasia grade III) with severe dysplasia 2001   FHx: cancer    HSV infection 1997   Hyperlipidemia 2018   Hypothyroidism    Libido, decreased 2011   Menopausal symptoms 2010   Orgasmic dysfunction 2006   Ovarian cyst    Pelvic relaxation 12/2000   PONV (postoperative nausea and vomiting)    scop patch has worked well in past   Sinusitis 01/2011   Stroke (HCC)     Objective:  Physical Exam: Vascular: DP/PT pulses 2/4 bilateral. CFT <3 seconds. Normal hair growth on digits. No edema.  Skin. No lacerations or abrasions bilateral feet. Thickened and discoloration noted of right hallux nail with subungual debris. No incurvation noted. Some erythema noted to medial nail fold  Musculoskeletal: MMT 5/5 bilateral lower extremities in DF, PF, Inversion and Eversion. Deceased ROM in DF of ankle joint.  Neurological: Sensation intact to light touch.   Assessment:   1. Onychomycosis       Plan:  Patient was evaluated and treated and all questions answered. -Examined patient -Discussed treatment options for painful dystrophic nails  -Culture postiive for t mentogrophytes -Discussed fungal nail treatment options including oral, topical, and laser treatments.  -Penlace sent to pharmacy  -Will also  start laser therapy.  -Patient to return after laser.     Asberry Failing, DPM

## 2024-01-29 ENCOUNTER — Encounter: Payer: Self-pay | Admitting: Podiatry

## 2024-02-03 ENCOUNTER — Ambulatory Visit (INDEPENDENT_AMBULATORY_CARE_PROVIDER_SITE_OTHER): Payer: Self-pay | Admitting: Podiatry

## 2024-02-03 DIAGNOSIS — B351 Tinea unguium: Secondary | ICD-10-CM

## 2024-02-03 NOTE — Patient Instructions (Signed)

## 2024-02-03 NOTE — Progress Notes (Signed)
 Patient presents today for the 1st laser treatment. Diagnosed with mycotic nail infection by Dr. Sikora.   Toenail most affected bilateral 1st.  All other systems are negative.  Nails were filed thin. Laser therapy was administered to 1st toenails bilaterally and patient tolerated the treatment well. All safety precautions were in place.   Post treatment instructions reviewed and provided to patient. Patient had no questions regarding plan of care.   Follow up in 4 weeks for laser # 2.

## 2024-02-05 ENCOUNTER — Ambulatory Visit: Admitting: Podiatry

## 2024-02-07 DIAGNOSIS — H5213 Myopia, bilateral: Secondary | ICD-10-CM | POA: Diagnosis not present

## 2024-02-07 DIAGNOSIS — H35373 Puckering of macula, bilateral: Secondary | ICD-10-CM | POA: Diagnosis not present

## 2024-02-07 DIAGNOSIS — H52203 Unspecified astigmatism, bilateral: Secondary | ICD-10-CM | POA: Diagnosis not present

## 2024-02-07 DIAGNOSIS — Z961 Presence of intraocular lens: Secondary | ICD-10-CM | POA: Diagnosis not present

## 2024-02-14 DIAGNOSIS — Z1231 Encounter for screening mammogram for malignant neoplasm of breast: Secondary | ICD-10-CM | POA: Diagnosis not present

## 2024-02-17 DIAGNOSIS — Z411 Encounter for cosmetic surgery: Secondary | ICD-10-CM | POA: Diagnosis not present

## 2024-02-17 DIAGNOSIS — L659 Nonscarring hair loss, unspecified: Secondary | ICD-10-CM | POA: Diagnosis not present

## 2024-02-17 DIAGNOSIS — H01119 Allergic dermatitis of unspecified eye, unspecified eyelid: Secondary | ICD-10-CM | POA: Diagnosis not present

## 2024-02-17 DIAGNOSIS — L309 Dermatitis, unspecified: Secondary | ICD-10-CM | POA: Diagnosis not present

## 2024-02-17 DIAGNOSIS — B079 Viral wart, unspecified: Secondary | ICD-10-CM | POA: Diagnosis not present

## 2024-02-17 DIAGNOSIS — L814 Other melanin hyperpigmentation: Secondary | ICD-10-CM | POA: Diagnosis not present

## 2024-03-02 DIAGNOSIS — S83242A Other tear of medial meniscus, current injury, left knee, initial encounter: Secondary | ICD-10-CM | POA: Diagnosis not present

## 2024-03-02 DIAGNOSIS — M94262 Chondromalacia, left knee: Secondary | ICD-10-CM | POA: Diagnosis not present

## 2024-03-02 DIAGNOSIS — S83272A Complex tear of lateral meniscus, current injury, left knee, initial encounter: Secondary | ICD-10-CM | POA: Diagnosis not present

## 2024-03-20 ENCOUNTER — Ambulatory Visit: Payer: Self-pay

## 2024-03-20 ENCOUNTER — Encounter: Payer: Self-pay | Admitting: Podiatry

## 2024-03-20 DIAGNOSIS — B351 Tinea unguium: Secondary | ICD-10-CM

## 2024-03-20 NOTE — Progress Notes (Signed)
 Patient presents today for the 2nd laser treatment. Diagnosed with mycotic nail infection by Dr. Tobie.   Toenail most affected bilateral 1st. Discolored portion of the right hallux nail (tip) broke off ( medial border). Patient states left hallux nail was tender on the medial border 1 week prior ( no tenderness today). Patient was instructed to make an appointment with provider if tenderness returns to be evaluated for possible ingrown toe nail of the left hallux.  All other systems are negative.  Nails were filed thin. Laser therapy was administered to 1st toenails bilaterally and patient tolerated the treatment well. All safety precautions were in place.   Post treatment instructions reviewed and provided to patient. Patient had no questions regarding plan of care.   Follow up in 4 weeks for laser # 3.

## 2024-03-20 NOTE — Telephone Encounter (Signed)
 Instructed patient on reasoning for different staff in laser room today, patient verbalized understanding. Patient also informed of RN leaving practice and made aware that it may be different staff members performing treatments, however Vernell would be the primary one doing the schedules on Friday. Patient verbalized understanding. 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 RN also reschedule the next laser appointment as it was scheduled too soon. Patient agreeable. Patient will see Dr. Joya 03/23/24 to determine if more laser treatments are needed.

## 2024-03-20 NOTE — Telephone Encounter (Signed)
 Kind of shocked not to see you. Felt that you were able to answer my questions. The part of the toenail remaining is pretty clear. I am wondering if I even need more treatments?

## 2024-03-23 ENCOUNTER — Ambulatory Visit: Admitting: Podiatry

## 2024-03-23 ENCOUNTER — Encounter: Payer: Self-pay | Admitting: Podiatry

## 2024-03-23 DIAGNOSIS — B351 Tinea unguium: Secondary | ICD-10-CM

## 2024-03-23 MED ORDER — CICLOPIROX 8 % EX SOLN: Freq: Every day | CUTANEOUS | 0 refills | Status: AC

## 2024-03-23 NOTE — Progress Notes (Signed)
"  °  Subjective:  Patient ID: Erica Chase, female    DOB: 1945-08-03,   MRN: [id number]  Chief Complaint  Patient presents with   Nail Problem    I think they're pretty good.    78 y.o. female presents for follow-up of fungal nails and to see how laser treatments have been going.  She has had 2 laser treatments thus far.  She has also been using the Penlac  daily.. Denies any other pedal complaints. Denies n/v/f/c.   Past Medical History:  Diagnosis Date   Abdominal pain 2004   ADD (attention deficit disorder) 08/2010   Allergic rhinitis 01/2011   Aortic insufficiency    Asthma    unsure-,reflux ruled out using nasonex and pulmocort   Atypical endocervical cells on Pap smear 1999   CIN III (cervical intraepithelial neoplasia grade III) with severe dysplasia 2001   FHx: cancer    HSV infection 1997   Hyperlipidemia 2018   Hypothyroidism    Libido, decreased 2011   Menopausal symptoms 2010   Orgasmic dysfunction 2006   Ovarian cyst    Pelvic relaxation 12/2000   PONV (postoperative nausea and vomiting)    scop patch has worked well in past   Sinusitis 01/2011   Stroke (HCC)     Objective:  Physical Exam: Vascular: DP/PT pulses 2/4 bilateral. CFT <3 seconds. Normal hair growth on digits. No edema.  Skin. No lacerations or abrasions bilateral feet. Thickened and discoloration noted of right hallux nail with subungual debris.  Distal right hallux with some yellowing noted.  But much improved. Musculoskeletal: MMT 5/5 bilateral lower extremities in DF, PF, Inversion and Eversion. Deceased ROM in DF of ankle joint.  Neurological: Sensation intact to light touch.   Assessment:   1. Onychomycosis       Plan:  Patient was evaluated and treated and all questions answered. -Examined patient -Discussed treatment options for painful dystrophic nails  - Culture positive for fungus. -Discussed fungal nail treatment options including oral, topical, and laser treatments.   -Will plan on doing 1 more laser treatment and 3 more months of the Penlac  and this should clear things up. -Patient to return in 3 months for recheck    Asberry Failing, DPM    "

## 2024-04-03 ENCOUNTER — Other Ambulatory Visit

## 2024-04-22 ENCOUNTER — Ambulatory Visit: Admitting: Podiatry

## 2024-04-22 ENCOUNTER — Encounter: Payer: Self-pay | Admitting: Podiatry

## 2024-04-22 DIAGNOSIS — M722 Plantar fascial fibromatosis: Secondary | ICD-10-CM | POA: Diagnosis not present

## 2024-04-22 NOTE — Progress Notes (Signed)
"  °  Subjective:  Patient ID: Erica Chase, female    DOB: 10-Nov-1945,   MRN: [id number]  Chief Complaint  Patient presents with   Foot Pain    I want her to check the bottom of my right foot.  I think I have a cyst.  I want to know if there's something I need to do about it. (Plantar 2nd tarsal area)    79 y.o. female presents for concern of mass on the bottom of her right foot.  She relates she noticed it several weeks ago.  She recently had knee replacement surgery on the left knee and has been doing stretching exercises more often.  She denies any pain with it.  Denies any other pedal complaints. Denies n/v/f/c.   Past Medical History:  Diagnosis Date   Abdominal pain 2004   ADD (attention deficit disorder) 08/2010   Allergic rhinitis 01/2011   Aortic insufficiency    Asthma    unsure-,reflux ruled out using nasonex and pulmocort   Atypical endocervical cells on Pap smear 1999   CIN III (cervical intraepithelial neoplasia grade III) with severe dysplasia 2001   FHx: cancer    HSV infection 1997   Hyperlipidemia 2018   Hypothyroidism    Libido, decreased 2011   Menopausal symptoms 2010   Orgasmic dysfunction 2006   Ovarian cyst    Pelvic relaxation 12/2000   PONV (postoperative nausea and vomiting)    scop patch has worked well in past   Sinusitis 01/2011   Stroke (HCC)     Objective:  Physical Exam: Vascular: DP/PT pulses 2/4 bilateral. CFT <3 seconds. Normal hair growth on digits. No edema.  Skin. No lacerations or abrasions bilateral feet. Thickened and discoloration noted of right hallux nail with subungual debris.  Distal right hallux with some yellowing noted.  But much improved. Plantar soft tissue mass 0.5 cm to plantar second metatarsal shaft. Mobile with the plantar fascia.  Musculoskeletal: MMT 5/5 bilateral lower extremities in DF, PF, Inversion and Eversion. Deceased ROM in DF of ankle joint.  Neurological: Sensation intact to light touch.   Assessment:    1. Plantar fascial fibromatosis        Plan:  Patient was evaluated and treated and all questions answered. Discussed the diagnosis of fibroma and treatment options with patient. Discussed offloading of the area and proper shoe wear. Discussed stretching Discussed steroid injection into the area if needed int he future.  Did discuss surgical options and advised patient that fibroma resection is not 100% guaranteed. Patient to follow-up as needed if symptoms fail to improve or worsen.    Asberry Failing, DPM    "

## 2024-04-22 NOTE — Patient Instructions (Signed)

## 2024-04-23 NOTE — Therapy (Unsigned)
 " OUTPATIENT PHYSICAL THERAPY LOWER EXTREMITY EVALUATION   Patient Name: Erica Chase MRN: [id number] DOB:October 13, 1945, 79 y.o., female Today's Date: 05/01/2024  END OF SESSION:  PT End of Session - 05/01/24 0941     Visit Number 1    Date for Recertification  06/12/24    Authorization Type [employer]   [employer]   PT Start Time [phone]    PT Stop Time 1015    PT Time Calculation (min) 44 min    Activity Tolerance Patient tolerated treatment well    Behavior During Therapy [facility] for tasks assessed/performed          Past Medical History:  Diagnosis Date   Abdominal pain 2004   ADD (attention deficit disorder) 08/2010   Allergic rhinitis 01/2011   Aortic insufficiency    Asthma    unsure-,reflux ruled out using nasonex and pulmocort   Atypical endocervical cells on Pap smear 1999   CIN III (cervical intraepithelial neoplasia grade III) with severe dysplasia 2001   FHx: cancer    HSV infection 1997   Hyperlipidemia 2018   Hypothyroidism    Libido, decreased 2011   Menopausal symptoms 2010   Orgasmic dysfunction 2006   Ovarian cyst    Pelvic relaxation 12/2000   PONV (postoperative nausea and vomiting)    scop patch has worked well in past   Sinusitis 01/2011   Stroke [facility])    Past Surgical History:  Procedure Laterality Date   CARPAL TUNNEL RELEASE     CATARACT EXTRACTION Left 2015   CHOLECYSTECTOMY  01/2011   KNEE ARTHROSCOPY     KNEE ARTHROSCOPY  03/05/2011   Procedure: ARTHROSCOPY KNEE;  Surgeon: Reyes JAYSON Billing;  Location: [facility];  Service: Orthopedics;  Laterality: Left;  left knee scope with debridement , Partial lateral meniscectomy   ROTATOR CUFF REPAIR     THUMB ARTHROSCOPY     thumb surgery     Patient Active Problem List   Diagnosis Date Noted   Snoring 12/23/2019   Sleep related hypoxia 12/23/2019   Severe sleep apnea 12/23/2019   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   Follow-up examination after eye surgery  08/03/2019   Posterior capsular opacification non visually significant of right eye 08/03/2019   Right epiretinal membrane 08/03/2019   Pseudophakia of both eyes 08/03/2019   Left epiretinal membrane 08/03/2019   Hypothyroidism 08/28/2011   Menopausal symptoms 08/28/2011   Decreased libido 08/28/2011   Hypercholesterolemia 08/28/2011   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  Been doing OPPT over in [place].  Pain in knee continues.  Still can't climb stairs without pain. Want to be able to get back onto a stair climber as that is the only way I can get my heart rate up.  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 Yes: NPRS scale: 0/10; worst 7/10 Pain location: Lt knee and Rt knee Pain description: sharp Aggravating factors: STS;  stairs Relieving factors: resting  PRECAUTIONS: None  RED FLAGS: None   WEIGHT BEARING RESTRICTIONS: No  FALLS:  Has patient fallen in last 6 months? No  LIVING ENVIRONMENT: Lives with: lives with their family Lives in: House/apartment Stairs: Yes: External: 3 steps; none Has following equipment at home: None  OCCUPATION: retired  PLOF: Independent  PATIENT GOALS: climb stairs without pain  NEXT MD VISIT: 4-6 weeks  OBJECTIVE:  Note: Objective measures were completed at Evaluation unless otherwise noted.  DIAGNOSTIC FINDINGS: 8/25 MRI bilateral  knees Impression:    1. 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 Bilateral medial lateral meniscus tears without mechanical symptoms of locking and giving way   PATIENT SURVEYS:  Lefs  COGNITION: Overall cognitive status: Within functional limits for tasks assessed     SENSATION: WFL  EDEMA:  None.  Pt reports some edema in lt ankle  MUSCLE LENGTH: Hamstrings: wfl   POSTURE: No Significant postural limitations  PALPATION: Mild TTP right medial joint line  LOWER EXTREMITY ROM:  WFL  LOWER EXTREMITY MMT:  MMT Right eval Left eval  Hip flexion 4 4  Hip extension    Hip abduction    Hip adduction    Hip internal rotation    Hip external rotation    Knee flexion 5 4+  Knee extension 5 4-  Ankle dorsiflexion    Ankle plantarflexion    Ankle inversion    Ankle eversion     (Blank rows = not tested)    FUNCTIONAL TESTS:  Timed up and go (TUG): 12.37  GAIT: Distance walked: 500 ft Assistive device utilized: None Level of assistance: Complete Independence Comments: initial antalgic off loading right, reduces after 10-15 ft                                                                                                                                TREATMENT 05/01/24 Eval Self care:Posture and body mechanic instruction; exercise dosing; use of stair climbers vs elliptical vs recumbent elliptical. Submerged stair climbing/step ups; treading water/ hip flex/ext (cold pool)   PATIENT EDUCATION:  Education details: Discussed eval findings, rehab rationale, aquatic program progression/POC and pools in area. Patient is in agreement  Person educated: Patient Education method: Explanation Education comprehension: verbalized understanding  HOME EXERCISE PROGRAM: Has Land based exercise from other OPPT Old aquatic HEP to be re-instructed and added   ASSESSMENT:  CLINICAL IMPRESSION: Patient is a 79 y.o. f who was seen today  for physical therapy evaluation and treatment for Lt knee pain post Lt knee arthroscopy (partial meniscectomies chondroplasty with patellofemoral pain secondary to her underlying chondromalacia ). She has been participating in land based PT but reports continued knee pain with stair climbing and reduced quad strength. She is well known to this clinic as she has an episode of care a few years ago for similar dx. She vu that she may receive bill for todays assessment due  to not formally being DC from land based PT ([place]).  She is aware and chooses to continue with assessment. Patient presents with pain limited deficits in  Lt > Rt knee affecting her  strength, ROM, activity tolerance, gait, and functional mobility with ADL's. She is having to modify and restrict ADL's as indicated by subjective information and objective measures which is affecting overall participation. Patient will benefit from skilled physical therapy in order to improve function and reduce impairment.    Pt with fair toleration to submerged step ups, deep water cycling and hip flex/ext.  In gym pt returns to report unable to initially tolerate recumbent elliptical.  OBJECTIVE IMPAIRMENTS: decreased mobility, difficulty walking, decreased strength, and pain.   ACTIVITY LIMITATIONS: lifting, squatting, stairs, transfers, and unable to tolerate stair climber when exercising  REHAB POTENTIAL: Good for overall function; fair for desired return to tolerating stair climber for exercise  CLINICAL DECISION MAKING: Stable/uncomplicated  EVALUATION COMPLEXITY: Low   GOALS: Goals reviewed with patient? Yes  SHORT TERM GOALS: Target date: 05/27/24 Complete LEFS next session Baseline: Goal status: INITIAL  2.  Pt will complete step ups without pain limitation in 4.0 ft Baseline:  Goal status: INITIAL  3.  Pt will report trial of deep water aerobic class at [facility] and report back toleration to explore alternate workouts to gain  desired elevation in HR. Baseline:  Goal status: INITIAL    LONG TERM GOALS: Target date: 06/12/24  Pt will be indep with final aquatic HEP for continued management of condition Baseline:  Goal status: INITIAL  2.  Pt will improve strength in bilateral hips and Lt knee by up to 1 grade to demonstrate improved overall physical function Baseline:  Goal status: INITIAL  3.  Pt will complete 10 STS from 3rd step without limitation to pain Baseline:  Goal status: INITIAL  4.  Pt will report reduced pain with stair climbing by 50% Baseline:  Goal status: INITIAL   PLAN:  PT FREQUENCY: 1-2x/week  PT DURATION: 6 weeks  PLANNED INTERVENTIONS: 97164- PT Re-evaluation, 97750- Physical Performance Testing, 97110-Therapeutic exercises, 97530- Therapeutic activity, 97112- Neuromuscular re-education, 97535- Self Care, 02859- Manual therapy, [phone]- Gait training, [phone]- Aquatic Therapy, [phone]- Electrical stimulation (unattended), [phone]- Electrical stimulation (manual), [id number]- Ionotophoresis 4mg /ml Dexamethasone , 79439 (1-2 muscles), 20561 (3+ muscles)- Dry Needling, Patient/Family education, Balance training, Stair training, Taping, Joint mobilization, DME instructions, Cryotherapy, and Moist heat  PLAN FOR NEXT SESSION: aquatics only: hip/knee and core strengthening, stair climbing; HEP   Ronal [place]) Vicy Medico MPT 05/01/24 1:29 PM [facility] [address] [place], [place], [address] Phone: [phone]   Fax:  [phone]  Referring diagnosis? M25.562 (ICD-10-CM) - Pain in left knee Treatment diagnosis? (if different than referring diagnosis) Pain in bilateral knees Lt>R What was this (referring dx) caused by? [x]  Surgery []  Fall [x]  Ongoing issue [x]  Arthritis []  Other: ____________  Laterality: []  Rt []  Lt [x]  Both  Check all possible CPT codes:  *CHOOSE 10 OR LESS*    See Planned Interventions listed in the Plan section  of the Evaluation.   "

## 2024-04-24 ENCOUNTER — Ambulatory Visit (INDEPENDENT_AMBULATORY_CARE_PROVIDER_SITE_OTHER): Payer: Self-pay

## 2024-04-24 ENCOUNTER — Ambulatory Visit: Admitting: Podiatry

## 2024-04-24 DIAGNOSIS — B351 Tinea unguium: Secondary | ICD-10-CM

## 2024-04-24 NOTE — Progress Notes (Deleted)
 Patient presents today for the 3rd laser treatment. Diagnosed with mycotic nail infection by Dr. Tobie.   Toenail most affected 1st bilaterally. Right hallux nail has shown significant improvement. Patient is satisfied with treatment results to date and would like to follow up with provider on 06/23/24 before scheduling further laser treatments.  All other systems are negative.  Nails were filed thin. Laser therapy was administered to 1st toenails bilaterally and patient tolerated the treatment well. All safety precautions were in place.   Post treatment instructions reviewed and provided to patient. Patient had no questions regarding plan of care.   Follow up with provider before scheduling further treatments.

## 2024-04-24 NOTE — Progress Notes (Addendum)
 Patient presents today for the 3rd laser treatment. Diagnosed with mycotic nail infection by Dr. Tobie.   Toenail most affected 1st bilaterally. Right hallux nail has shown significant improvement. Patient is satisfied with treatment results to date and would like to follow up with provider on 06/23/24 before scheduling further laser treatments.  All other systems are negative.  Nails were filed thin. Laser therapy was administered to 1st toenails bilaterally and patient tolerated the treatment well. All safety precautions were in place.   Post treatment instructions reviewed and provided to patient. Patient had no questions regarding plan of care.   Follow up with provider before scheduling further treatments.

## 2024-04-27 ENCOUNTER — Ambulatory Visit: Admitting: Podiatry

## 2024-05-01 ENCOUNTER — Other Ambulatory Visit: Payer: Self-pay

## 2024-05-01 ENCOUNTER — Ambulatory Visit (HOSPITAL_BASED_OUTPATIENT_CLINIC_OR_DEPARTMENT_OTHER): Attending: Specialist | Admitting: Physical Therapy

## 2024-05-01 ENCOUNTER — Encounter (HOSPITAL_BASED_OUTPATIENT_CLINIC_OR_DEPARTMENT_OTHER): Payer: Self-pay | Admitting: Physical Therapy

## 2024-05-01 DIAGNOSIS — G8929 Other chronic pain: Secondary | ICD-10-CM | POA: Insufficient documentation

## 2024-05-01 DIAGNOSIS — M6281 Muscle weakness (generalized): Secondary | ICD-10-CM | POA: Diagnosis present

## 2024-05-01 DIAGNOSIS — M25562 Pain in left knee: Secondary | ICD-10-CM | POA: Diagnosis present

## 2024-05-01 DIAGNOSIS — M25561 Pain in right knee: Secondary | ICD-10-CM | POA: Diagnosis present

## 2024-05-06 ENCOUNTER — Encounter (HOSPITAL_BASED_OUTPATIENT_CLINIC_OR_DEPARTMENT_OTHER): Payer: Self-pay | Admitting: Physical Therapy

## 2024-05-06 ENCOUNTER — Ambulatory Visit (HOSPITAL_BASED_OUTPATIENT_CLINIC_OR_DEPARTMENT_OTHER): Admitting: Physical Therapy

## 2024-05-06 DIAGNOSIS — M6281 Muscle weakness (generalized): Secondary | ICD-10-CM

## 2024-05-06 DIAGNOSIS — G8929 Other chronic pain: Secondary | ICD-10-CM

## 2024-05-06 NOTE — Therapy (Unsigned)
 " OUTPATIENT PHYSICAL THERAPY LOWER EXTREMITY TREATMENT   Patient Name: Erica Chase MRN: [id number] DOB:1945/09/15, 79 y.o., female Today's Date: 05/06/2024  END OF SESSION:  PT End of Session - 05/06/24 1648     Visit Number 2    Date for Recertification  06/12/24    Authorization Type humana mcr   humana mcr   PT Start Time 1646    PT Stop Time 1725    PT Time Calculation (min) 39 min    Activity Tolerance Patient tolerated treatment well    Behavior During Therapy [facility] for tasks assessed/performed          Past Medical History:  Diagnosis Date   Abdominal pain 2004   ADD (attention deficit disorder) 08/2010   Allergic rhinitis 01/2011   Aortic insufficiency    Asthma    unsure-,reflux ruled out using nasonex and pulmocort   Atypical endocervical cells on Pap smear 1999   CIN III (cervical intraepithelial neoplasia grade III) with severe dysplasia 2001   FHx: cancer    HSV infection 1997   Hyperlipidemia 2018   Hypothyroidism    Libido, decreased 2011   Menopausal symptoms 2010   Orgasmic dysfunction 2006   Ovarian cyst    Pelvic relaxation 12/2000   PONV (postoperative nausea and vomiting)    scop patch has worked well in past   Sinusitis 01/2011   Stroke [facility])    Past Surgical History:  Procedure Laterality Date   CARPAL TUNNEL RELEASE     CATARACT EXTRACTION Left 2015   CHOLECYSTECTOMY  01/2011   KNEE ARTHROSCOPY     KNEE ARTHROSCOPY  03/05/2011   Procedure: ARTHROSCOPY KNEE;  Surgeon: Reyes JAYSON Billing;  Location: [facility];  Service: Orthopedics;  Laterality: Left;  left knee scope with debridement , Partial lateral meniscectomy   ROTATOR CUFF REPAIR     THUMB ARTHROSCOPY     thumb surgery     Patient Active Problem List   Diagnosis Date Noted   Snoring 12/23/2019   Sleep related hypoxia 12/23/2019   Severe sleep apnea 12/23/2019   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   Follow-up examination after eye surgery  08/03/2019   Posterior capsular opacification non visually significant of right eye 08/03/2019   Right epiretinal membrane 08/03/2019   Pseudophakia of both eyes 08/03/2019   Left epiretinal membrane 08/03/2019   Hypothyroidism 08/28/2011   Menopausal symptoms 08/28/2011   Decreased libido 08/28/2011   Hypercholesterolemia 08/28/2011   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 Pain walking down hall 1/10. If I am climbing stairs or standing up pout of a chair it is a 9/10.  Initial Subjective 12/1 Lt knee surgery.  Been doing OPPT over in [place].  Pain in knee continues.  Still can't climb stairs without pain. Want to be able to get back onto a stair climber as that is the only way I can get my heart rate up.  PERTINENT HISTORY: 04/13/24 as per MD: Status post knee arthroscopy partial meniscectomies chondroplasty with patellofemoral pain secondary to  her underlying chondromalacia  PAIN:  Are you having pain? Yes: NPRS scale: 0/10; worst 7/10 Pain location: Lt knee and Rt knee Pain description: sharp Aggravating factors: STS; stairs Relieving factors: resting  PRECAUTIONS: None  RED FLAGS: None   WEIGHT BEARING RESTRICTIONS: No  FALLS:  Has patient fallen in last 6 months? No  LIVING ENVIRONMENT: Lives with: lives with their family Lives in: House/apartment Stairs: Yes: External: 3 steps; none Has following equipment at home: None  OCCUPATION: retired  PLOF: Independent  PATIENT GOALS: climb stairs without pain  NEXT MD VISIT: 4-6  weeks  OBJECTIVE:  Note: Objective measures were completed at Evaluation unless otherwise noted.  DIAGNOSTIC FINDINGS: 8/25 MRI bilateral knees Impression:    1. 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 Bilateral medial lateral meniscus tears without mechanical symptoms of locking and giving way   PATIENT SURVEYS:  Lefs  COGNITION: Overall cognitive status: Within functional limits for tasks assessed     SENSATION: WFL  EDEMA:  None.  Pt reports some edema in lt ankle  MUSCLE LENGTH: Hamstrings: wfl   POSTURE: No Significant postural limitations  PALPATION: Mild TTP right medial joint line  LOWER EXTREMITY ROM:  WFL  LOWER EXTREMITY MMT:  MMT Right eval Left eval  Hip flexion 4 4  Hip extension    Hip abduction    Hip adduction    Hip internal rotation    Hip external rotation    Knee flexion 5 4+  Knee extension 5 4-  Ankle dorsiflexion    Ankle plantarflexion    Ankle inversion    Ankle eversion     (Blank rows = not tested)    FUNCTIONAL TESTS:  Timed up and go (TUG): 12.37  GAIT: Distance walked: 500 ft Assistive device utilized: None Level of assistance: Complete Independence Comments: initial antalgic off loading right, reduces after 10-15 ft                                                                                                                                TREATMENT [facility] Adult PT Treatment:                                                DATE: 05/06/24 Pt seen for aquatic therapy today.  Treatment took place in water 3.5-4.75 ft in depth at the [facility] pool. Temp of water was 91.  Pt entered/exited the pool via stairs and step to pattern with hand rail.   *walking forward, back and side stepping in 3.6 ft with unsupported *forward march with kick->backward (some pain with TKE Lt) *side stepping ue add/abd using yellow HB-> slight lunge *forward soldier walk (no pain) *Ue support on  yellow HB: toe raises x 10; heel raises x 10; HS curl (no pain Lt  knee);  relaxed squats  4.0 ft (some pain) *forward split lunge UE support yellow HB alternating x 5 *backward lunge not tolerated Lt knee *step ups leading R then L x5 (cues for mindful movements) *runners step ups ue support hand rails for support as well as assistance as needed.   Pt requires the buoyancy and hydrostatic pressure of water for support, and to offload joints by unweighting joint load by at least 50 % in navel deep water and by at least 75-80% in chest to neck deep water.  Viscosity of the water is needed for resistance of strengthening. Water current perturbations provides challenge to standing balance requiring increased core activation.      PATIENT EDUCATION:  Education details: Discussed eval findings, rehab rationale, aquatic program progression/POC and pools in area. Patient is in agreement  Person educated: Patient Education method: Explanation Education comprehension: verbalized understanding  HOME EXERCISE PROGRAM: Has Land based exercise from other OPPT Old aquatic HEP to be re-instructed and added   ASSESSMENT:  CLINICAL IMPRESSION: Pt demonstrates safety and independence in aquatic setting with therapist instructing from deck. She is confident in setting, moving throughout all depths easily.  Pt is directed through various movement patterns and trials in both sitting and standing positions.   She is provided VC and demonstration throughout session for execution of exercises while monitoring toleration.complaints of Lt knee pain as noted above in treatment notes.  Worked cautiously into the pain threshold.  She does report less discomfort with step ups from last session. Will continue as tolerated.  Goals are ongoing.       Initial Impression Patient is a 79 y.o. f who was seen today for physical therapy evaluation and treatment for Lt knee pain post Lt knee arthroscopy (partial  meniscectomies chondroplasty with patellofemoral pain secondary to her underlying chondromalacia ). She has been participating in land based PT but reports continued knee pain with stair climbing and reduced quad strength. She is well known to this clinic as she has an episode of care a few years ago for similar dx. She vu that she may receive bill for todays assessment due to not formally being DC from land based PT [place]).  She is aware and chooses to continue with assessment. Patient presents with pain limited deficits in  Lt > Rt knee affecting her  strength, ROM, activity tolerance, gait, and functional mobility with ADL's. She is having to modify and restrict ADL's as indicated by subjective information and objective measures which is affecting overall participation. Patient will benefit from skilled physical therapy in order to improve function and reduce impairment.    Pt with fair toleration to submerged step ups, deep water cycling and hip flex/ext.  In gym pt returns to report unable to initially tolerate recumbent elliptical.  OBJECTIVE IMPAIRMENTS: decreased mobility, difficulty walking, decreased strength, and pain.   ACTIVITY LIMITATIONS: lifting, squatting, stairs, transfers, and unable to tolerate stair climber when exercising  REHAB POTENTIAL: Good for overall function; fair for desired return to tolerating stair climber for exercise  CLINICAL DECISION MAKING: Stable/uncomplicated  EVALUATION COMPLEXITY: Low   GOALS: Goals reviewed with patient? Yes  SHORT TERM GOALS: Target date: 05/27/24 Complete LEFS next session Baseline: Goal status: INITIAL  2.  Pt will complete step ups without pain limitation in 4.0 ft Baseline:  Goal status: INITIAL  3.  Pt will report trial of deep water aerobic class at [facility] and report back toleration to explore alternate workouts to gain desired elevation in  HR. Baseline:  Goal status: INITIAL    LONG TERM GOALS: Target date:  06/12/24  Pt will be indep with final aquatic HEP for continued management of condition Baseline:  Goal status: INITIAL  2.  Pt will improve strength in bilateral hips and Lt knee by up to 1 grade to demonstrate improved overall physical function Baseline:  Goal status: INITIAL  3.  Pt will complete 10 STS from 3rd step without limitation to pain Baseline:  Goal status: INITIAL  4.  Pt will report reduced pain with stair climbing by 50% Baseline:  Goal status: INITIAL   PLAN:  PT FREQUENCY: 1-2x/week  PT DURATION: 6 weeks  PLANNED INTERVENTIONS: 97164- PT Re-evaluation, 97750- Physical Performance Testing, 97110-Therapeutic exercises, 97530- Therapeutic activity, 97112- Neuromuscular re-education, 97535- Self Care, 02859- Manual therapy, [phone]- Gait training, [id number]- Aquatic Therapy, [phone]- Electrical stimulation (unattended),  [phone]- Ionotophoresis 4mg /ml Dexamethasone , 79439 (1-2 muscles), 20561 (3+ muscles)- Dry Needling, Patient/Family education, Balance training, Stair training, Taping, Joint mobilization, DME instructions, Cryotherapy, and Moist heat  PLAN FOR NEXT SESSION: aquatics only: hip/knee and core strengthening, stair climbing; HEP   [place]) Kaliah Haddaway MPT 05/06/24 4:50 PM [facility] [address] [place], [place], [address] Phone: [phone]   Fax:  [phone]  Referring diagnosis? M25.562 (ICD-10-CM) - Pain in left knee Treatment diagnosis? (if different than referring diagnosis) Pain in bilateral knees Lt>R What was this (referring dx) caused by? [x]  Surgery []  Fall [x]  Ongoing issue [x]  Arthritis []  Other: ____________  Laterality: []  Rt []  Lt [x]  Both  Check all possible CPT codes:  *CHOOSE 10 OR LESS*    See Planned Interventions listed in the Plan section of the Evaluation.   "

## 2024-05-11 ENCOUNTER — Ambulatory Visit (HOSPITAL_BASED_OUTPATIENT_CLINIC_OR_DEPARTMENT_OTHER): Admitting: Physical Therapy

## 2024-05-22 ENCOUNTER — Ambulatory Visit (HOSPITAL_BASED_OUTPATIENT_CLINIC_OR_DEPARTMENT_OTHER): Admitting: Physical Therapy

## 2024-05-27 ENCOUNTER — Ambulatory Visit (HOSPITAL_BASED_OUTPATIENT_CLINIC_OR_DEPARTMENT_OTHER): Payer: Self-pay | Admitting: Physical Therapy

## 2024-06-23 ENCOUNTER — Ambulatory Visit: Admitting: Podiatry

## 2024-06-23 ENCOUNTER — Ambulatory Visit
